# Patient Record
Sex: Female | Born: 1955
Health system: Southern US, Community
[De-identification: ages and names within clinical notes are randomized; demographics above are authoritative.]

## PROBLEM LIST (undated history)

## (undated) DIAGNOSIS — D649 Anemia, unspecified: Secondary | ICD-10-CM

## (undated) DIAGNOSIS — K8020 Calculus of gallbladder without cholecystitis without obstruction: Secondary | ICD-10-CM

## (undated) DIAGNOSIS — R011 Cardiac murmur, unspecified: Secondary | ICD-10-CM

## (undated) DIAGNOSIS — K635 Polyp of colon: Secondary | ICD-10-CM

## (undated) DIAGNOSIS — J309 Allergic rhinitis, unspecified: Secondary | ICD-10-CM

## (undated) DIAGNOSIS — E0500 Thyrotoxicosis with diffuse goiter without thyrotoxic crisis or storm: Secondary | ICD-10-CM

## (undated) DIAGNOSIS — M810 Age-related osteoporosis without current pathological fracture: Secondary | ICD-10-CM

## (undated) DIAGNOSIS — E785 Hyperlipidemia, unspecified: Secondary | ICD-10-CM

## (undated) DIAGNOSIS — F419 Anxiety disorder, unspecified: Secondary | ICD-10-CM

## (undated) DIAGNOSIS — J189 Pneumonia, unspecified organism: Secondary | ICD-10-CM

## (undated) DIAGNOSIS — F32A Depression, unspecified: Secondary | ICD-10-CM

## (undated) DIAGNOSIS — K760 Fatty (change of) liver, not elsewhere classified: Secondary | ICD-10-CM

## (undated) DIAGNOSIS — K4090 Unilateral inguinal hernia, without obstruction or gangrene, not specified as recurrent: Secondary | ICD-10-CM

## (undated) DIAGNOSIS — B370 Candidal stomatitis: Secondary | ICD-10-CM

## (undated) DIAGNOSIS — G5600 Carpal tunnel syndrome, unspecified upper limb: Secondary | ICD-10-CM

## (undated) DIAGNOSIS — F329 Major depressive disorder, single episode, unspecified: Secondary | ICD-10-CM

## (undated) DIAGNOSIS — Z79891 Long term (current) use of opiate analgesic: Secondary | ICD-10-CM

## (undated) DIAGNOSIS — F4310 Post-traumatic stress disorder, unspecified: Secondary | ICD-10-CM

## (undated) DIAGNOSIS — H269 Unspecified cataract: Secondary | ICD-10-CM

## (undated) DIAGNOSIS — M1990 Unspecified osteoarthritis, unspecified site: Secondary | ICD-10-CM

## (undated) DIAGNOSIS — T7840XA Allergy, unspecified, initial encounter: Secondary | ICD-10-CM

## (undated) DIAGNOSIS — R42 Dizziness and giddiness: Secondary | ICD-10-CM

## (undated) HISTORY — DX: Anxiety disorder, unspecified: F41.9

## (undated) HISTORY — PX: UPPER GASTROINTESTINAL ENDOSCOPY: SHX188

## (undated) HISTORY — DX: Fatty (change of) liver, not elsewhere classified: K76.0

## (undated) HISTORY — DX: Unspecified osteoarthritis, unspecified site: M19.90

## (undated) HISTORY — DX: Unilateral inguinal hernia, without obstruction or gangrene, not specified as recurrent: K40.90

## (undated) HISTORY — DX: Hyperlipidemia, unspecified: E78.5

## (undated) HISTORY — DX: Allergy, unspecified, initial encounter: T78.40XA

## (undated) HISTORY — PX: TONSILLECTOMY AND ADENOIDECTOMY: SUR1326

## (undated) HISTORY — DX: Candidal stomatitis: B37.0

## (undated) HISTORY — DX: Polyp of colon: K63.5

## (undated) HISTORY — DX: Pneumonia, unspecified organism: J18.9

## (undated) HISTORY — PX: OTHER SURGICAL HISTORY: SHX169

## (undated) HISTORY — PX: COLONOSCOPY: SHX174

## (undated) HISTORY — DX: Anemia, unspecified: D64.9

## (undated) HISTORY — PX: POLYPECTOMY: SHX149

## (undated) HISTORY — DX: Thyrotoxicosis with diffuse goiter without thyrotoxic crisis or storm: E05.00

## (undated) HISTORY — DX: Allergic rhinitis, unspecified: J30.9

## (undated) HISTORY — DX: Unspecified cataract: H26.9

## (undated) HISTORY — DX: Long term (current) use of opiate analgesic: Z79.891

## (undated) HISTORY — DX: Major depressive disorder, single episode, unspecified: F32.9

## (undated) HISTORY — DX: Post-traumatic stress disorder, unspecified: F43.10

## (undated) HISTORY — PX: CERVICAL DISC SURGERY: SHX588

## (undated) HISTORY — PX: INGUINAL HERNIA REPAIR: SUR1180

## (undated) HISTORY — DX: Dizziness and giddiness: R42

## (undated) HISTORY — DX: Carpal tunnel syndrome, unspecified upper limb: G56.00

## (undated) HISTORY — DX: Depression, unspecified: F32.A

## (undated) HISTORY — DX: Calculus of gallbladder without cholecystitis without obstruction: K80.20

## (undated) HISTORY — PX: APPENDECTOMY: SHX54

## (undated) HISTORY — DX: Cardiac murmur, unspecified: R01.1

## (undated) HISTORY — DX: Age-related osteoporosis without current pathological fracture: M81.0

## (undated) HISTORY — PX: CHOLECYSTECTOMY: SHX55

---

## 1999-01-07 ENCOUNTER — Other Ambulatory Visit: Admission: RE | Admit: 1999-01-07 | Discharge: 1999-01-07 | Payer: Self-pay | Admitting: Gynecology

## 1999-09-24 ENCOUNTER — Encounter: Payer: Self-pay | Admitting: Internal Medicine

## 1999-09-24 ENCOUNTER — Ambulatory Visit (HOSPITAL_COMMUNITY): Admission: RE | Admit: 1999-09-24 | Discharge: 1999-09-24 | Payer: Self-pay | Admitting: Internal Medicine

## 2000-04-13 ENCOUNTER — Other Ambulatory Visit: Admission: RE | Admit: 2000-04-13 | Discharge: 2000-04-13 | Payer: Self-pay | Admitting: Gynecology

## 2001-12-07 ENCOUNTER — Emergency Department (HOSPITAL_COMMUNITY): Admission: EM | Admit: 2001-12-07 | Discharge: 2001-12-07 | Payer: Self-pay | Admitting: Emergency Medicine

## 2001-12-07 ENCOUNTER — Encounter: Payer: Self-pay | Admitting: Emergency Medicine

## 2002-09-13 ENCOUNTER — Encounter: Admission: RE | Admit: 2002-09-13 | Discharge: 2002-09-13 | Payer: Self-pay | Admitting: Family Medicine

## 2002-10-31 ENCOUNTER — Encounter: Admission: RE | Admit: 2002-10-31 | Discharge: 2002-10-31 | Payer: Self-pay | Admitting: Family Medicine

## 2002-11-25 ENCOUNTER — Encounter: Admission: RE | Admit: 2002-11-25 | Discharge: 2002-11-25 | Payer: Self-pay | Admitting: Family Medicine

## 2002-12-18 ENCOUNTER — Encounter: Admission: RE | Admit: 2002-12-18 | Discharge: 2002-12-18 | Payer: Self-pay | Admitting: Family Medicine

## 2002-12-19 ENCOUNTER — Emergency Department (HOSPITAL_COMMUNITY): Admission: EM | Admit: 2002-12-19 | Discharge: 2002-12-19 | Payer: Self-pay | Admitting: Emergency Medicine

## 2003-01-07 ENCOUNTER — Encounter: Admission: RE | Admit: 2003-01-07 | Discharge: 2003-03-07 | Payer: Self-pay | Admitting: Orthopedic Surgery

## 2003-08-30 DIAGNOSIS — E0500 Thyrotoxicosis with diffuse goiter without thyrotoxic crisis or storm: Secondary | ICD-10-CM

## 2003-08-30 HISTORY — DX: Thyrotoxicosis with diffuse goiter without thyrotoxic crisis or storm: E05.00

## 2003-12-25 ENCOUNTER — Encounter: Admission: RE | Admit: 2003-12-25 | Discharge: 2004-03-24 | Payer: Self-pay | Admitting: Orthopedic Surgery

## 2004-01-04 ENCOUNTER — Encounter: Admission: RE | Admit: 2004-01-04 | Discharge: 2004-01-04 | Payer: Self-pay | Admitting: Orthopedic Surgery

## 2005-05-27 ENCOUNTER — Encounter: Admission: RE | Admit: 2005-05-27 | Discharge: 2005-05-27 | Payer: Self-pay | Admitting: Internal Medicine

## 2005-07-29 ENCOUNTER — Encounter: Admission: RE | Admit: 2005-07-29 | Discharge: 2005-07-29 | Payer: Self-pay | Admitting: Neurology

## 2006-10-26 DIAGNOSIS — H60399 Other infective otitis externa, unspecified ear: Secondary | ICD-10-CM | POA: Insufficient documentation

## 2006-10-26 DIAGNOSIS — F339 Major depressive disorder, recurrent, unspecified: Secondary | ICD-10-CM

## 2010-01-21 ENCOUNTER — Encounter: Admission: RE | Admit: 2010-01-21 | Discharge: 2010-01-21 | Payer: Self-pay | Admitting: Orthopedic Surgery

## 2010-02-23 ENCOUNTER — Encounter: Payer: Self-pay | Admitting: *Deleted

## 2010-08-26 ENCOUNTER — Encounter
Admission: RE | Admit: 2010-08-26 | Discharge: 2010-08-26 | Payer: Self-pay | Source: Home / Self Care | Attending: Internal Medicine | Admitting: Internal Medicine

## 2010-09-28 NOTE — Miscellaneous (Signed)
Summary: Dr. Zenda Alpers office  Clinical Lists Changes rec'd mssage from [place] at this md office. pt needs hand surgery & they want our NPI. told her no. has not been here in 4 yrs. we are on her card & she has been being seen at another ortho md. had medicare with medicaid secondary. she will call pt & tell her to find a pcp & get our name off her card. told her there are 2 new clinics on [address] that are taking new pts.Golden Circle RN  February 23, 2010 3:11 PM

## 2010-10-12 ENCOUNTER — Ambulatory Visit (HOSPITAL_COMMUNITY)
Admission: RE | Admit: 2010-10-12 | Discharge: 2010-10-12 | Disposition: A | Discharge status: Home / Self Care | Payer: Medicare Other | Source: Ambulatory Visit | Attending: Neurosurgery | Admitting: Neurosurgery

## 2010-10-12 ENCOUNTER — Other Ambulatory Visit (HOSPITAL_COMMUNITY): Payer: Self-pay | Admitting: Neurosurgery

## 2010-10-12 ENCOUNTER — Encounter (HOSPITAL_COMMUNITY)
Admission: RE | Admit: 2010-10-12 | Discharge: 2010-10-12 | Disposition: A | Discharge status: Home / Self Care | Payer: Medicare Other | Source: Ambulatory Visit | Attending: Neurosurgery | Admitting: Neurosurgery

## 2010-10-12 DIAGNOSIS — F17200 Nicotine dependence, unspecified, uncomplicated: Secondary | ICD-10-CM | POA: Insufficient documentation

## 2010-10-12 DIAGNOSIS — M47812 Spondylosis without myelopathy or radiculopathy, cervical region: Secondary | ICD-10-CM | POA: Insufficient documentation

## 2010-10-12 DIAGNOSIS — I10 Essential (primary) hypertension: Secondary | ICD-10-CM | POA: Insufficient documentation

## 2010-10-12 DIAGNOSIS — Z01818 Encounter for other preprocedural examination: Secondary | ICD-10-CM | POA: Insufficient documentation

## 2010-10-12 LAB — CBC
HCT: 39.7 % (ref 36.0–46.0)
Hemoglobin: 13.1 g/dL (ref 12.0–15.0)
MCHC: 33 g/dL (ref 30.0–36.0)
MCV: 92.5 fL (ref 78.0–100.0)
RDW: 13.4 % (ref 11.5–15.5)
WBC: 7.4 10*3/uL (ref 4.0–10.5)

## 2010-10-12 LAB — BASIC METABOLIC PANEL
BUN: 5 mg/dL — ABNORMAL LOW (ref 6–23)
CO2: 30 mEq/L (ref 19–32)
Calcium: 9.4 mg/dL (ref 8.4–10.5)
Chloride: 102 mEq/L (ref 96–112)
Creatinine, Ser: 0.88 mg/dL (ref 0.4–1.2)
GFR calc non Af Amer: >60 mL/min (ref >60)

## 2010-10-12 LAB — SURGICAL PCR SCREEN: Staphylococcus aureus: NEGATIVE

## 2010-10-19 ENCOUNTER — Inpatient Hospital Stay (HOSPITAL_COMMUNITY): Payer: Medicare Other

## 2010-10-19 ENCOUNTER — Other Ambulatory Visit (HOSPITAL_COMMUNITY): Payer: Self-pay | Admitting: Neurosurgery

## 2010-10-19 ENCOUNTER — Observation Stay (HOSPITAL_COMMUNITY)
Admission: RE | Admit: 2010-10-19 | Discharge: 2010-10-20 | Disposition: A | Discharge status: Home / Self Care | Payer: Medicare Other | Source: Ambulatory Visit | Attending: Neurosurgery | Admitting: Neurosurgery

## 2010-10-19 DIAGNOSIS — M47812 Spondylosis without myelopathy or radiculopathy, cervical region: Secondary | ICD-10-CM | POA: Insufficient documentation

## 2010-10-19 DIAGNOSIS — J449 Chronic obstructive pulmonary disease, unspecified: Secondary | ICD-10-CM | POA: Insufficient documentation

## 2010-10-19 DIAGNOSIS — M5030 Other cervical disc degeneration, unspecified cervical region: Secondary | ICD-10-CM | POA: Insufficient documentation

## 2010-10-19 DIAGNOSIS — M5020 Other cervical disc displacement, unspecified cervical region: Principal | ICD-10-CM | POA: Insufficient documentation

## 2010-10-19 DIAGNOSIS — J4489 Other specified chronic obstructive pulmonary disease: Secondary | ICD-10-CM | POA: Insufficient documentation

## 2010-10-19 DIAGNOSIS — G8929 Other chronic pain: Secondary | ICD-10-CM | POA: Insufficient documentation

## 2010-10-21 NOTE — Op Note (Signed)
[name], [name]            ACCOUNT NO.:  [id number]  MEDICAL RECORD NO.:  [id number]           PATIENT TYPE:  I  LOCATION:  3535                         FACILITY:  [facility]  PHYSICIAN:  Danae Orleans. Venetia Maxon, M.D.  DATE OF BIRTH:  04-10-1956  DATE OF PROCEDURE:  10/19/2010 DATE OF DISCHARGE:                              OPERATIVE REPORT   PREOPERATIVE DIAGNOSIS:  Herniated cervical disk with spondylosis, degenerative disk disease, and radiculopathy C5-C6 and C6-C7 levels.  POSTOPERATIVE DIAGNOSIS:  Herniated cervical disk with spondylosis, degenerative disk disease, and radiculopathy C5-C6 and C6-C7 levels.  PROCEDURE:  Anterior cervical decompression and fusion C5-C6 and C6-C7 levels, autograft, allograft, PureGen, and anterior cervical plate.  SURGEON:  Danae Orleans. Venetia Maxon, MD  ASSISTANTS: 1. Georgiann Cocker, RN 2. Coletta Memos, MD  ANESTHESIA:  General endotracheal anesthesia.  ESTIMATED BLOOD LOSS:  Minimal.  COMPLICATIONS:  None.  DISPOSITION:  Recovery.  INDICATIONS:  [name] is a 55 year old woman with significant cervical spondylosis C5-C6 and C6-C7 levels on the left with left C6 and C7 radiculopathy.  It was elected to take her to Surgery for anterior decompression and fusion at these affected levels.  DESCRIPTION OF PROCEDURE:  [name] was brought to the operating room.  Following satisfactory and uncomplicated induction of general endotracheal anesthesia plus intravenous lines, the patient was placed in the supine position on the operating table.  Her neck was placed in slight extension.  She was placed in 5-pound halter traction.  Her anterior neck was then prepped and draped in usual sterile fashion. Area of planned incision was infiltrated with local lidocaine.  Incision was made from the midline to the anterior border of the sternocleidomastoid muscle, carried sharply through the platysma layer. Subplatysmal dissection was performed exposing the  anterior border of sternocleidomastoid muscle using blunt dissection and the carotid sheath was kept lateral, trachea and esophagus kept medial, exposing the anterior cervical spine.  A bent spinal needle was placed at what was felt to be C4-C5 and C5-C6 levels and this was confirmed on intraoperative x-ray.  Subsequently, the longus colli muscles were taken down from C5 through C7 using electrocautery and Key elevator and self- retaining and shadow line retractors were placed to facilitate exposure. The ventral osteophytes were removed at both the C5-C6 and C6-C7 levels. Thorough diskectomies were performed at each level.  Endplates were eburnated with high-speed drill and uncinate spurs were drilled down. The spinal cord dura was decompressed as were both C6 nerve roots and subsequently C5-C6 and C7 nerve roots at C6-C7.  After trial sizing, 5- mm PEEK interbody cages were packed with PureGen-soaked bone sponge and also autograft and a similarly-sized implant was placed at the C5-C6 level.  A 30-mm Trestle anterior cervical plate was then affixed to anterior cervical spine using variable-angled 12-mm screws, 2 at C5, 2 at C6, 2 at C7; all screws had excellent purchases.  Locking mechanisms were engaged.  Final x-ray demonstrated well-positioned upper aspect of the construct.  The wound was irrigated.  Soft tissues were inspected and found to be in good repair.  Hemostasis was assured.  The platysma layer was closed with 3-0  Vicryl sutures.  Skin edges were approximated with 3-0 Vicryl subcuticular stitch.  The wound was dressed with Dermabond.  The patient was extubated in the operating room and taken to the recovery in stable and satisfactory condition having tolerated the operation well.  Counts were correct at the end of the case.     Danae Orleans. Venetia Maxon, M.D.     JDS/MEDQ  D:  10/19/2010  T:  10/20/2010  Job:  [id number]  Electronically Signed by Maeola Harman M.D. on 10/21/2010  07:37:27 AM

## 2010-12-22 LAB — TSH: TSH: 0.78 u[IU]/mL (ref <=5.90)

## 2011-04-15 LAB — BASIC METABOLIC PANEL: Glucose: 103 mg/dL

## 2011-04-15 LAB — LIPID PANEL
LDL Cholesterol: 227 mg/dL
Triglycerides: 227 mg/dL — AB (ref 40–160)

## 2011-06-29 LAB — LIPID PANEL
LDL Cholesterol: 112 mg/dL
Triglycerides: 167 mg/dL — AB (ref 40–160)

## 2011-08-22 ENCOUNTER — Ambulatory Visit (INDEPENDENT_AMBULATORY_CARE_PROVIDER_SITE_OTHER): Payer: Medicare Other

## 2011-08-22 DIAGNOSIS — M79609 Pain in unspecified limb: Secondary | ICD-10-CM

## 2011-08-22 DIAGNOSIS — M19049 Primary osteoarthritis, unspecified hand: Secondary | ICD-10-CM

## 2011-10-13 DIAGNOSIS — E0500 Thyrotoxicosis with diffuse goiter without thyrotoxic crisis or storm: Secondary | ICD-10-CM | POA: Diagnosis not present

## 2011-10-18 ENCOUNTER — Telehealth: Payer: Self-pay

## 2011-10-18 DIAGNOSIS — G8929 Other chronic pain: Secondary | ICD-10-CM

## 2011-10-18 NOTE — Telephone Encounter (Signed)
.  [facility]    PT REQUESTING DEMEROL REFILL   BEST PHONE [phone]

## 2011-10-19 MED ORDER — MEPERIDINE HCL 100 MG PO TABS: 100.0000 mg | ORAL_TABLET | Freq: Four times a day (QID) | ORAL | Status: DC | PRN

## 2011-10-19 NOTE — Telephone Encounter (Signed)
Refilled 100mg  #120 leftat102 Notify can pick up

## 2011-10-19 NOTE — Telephone Encounter (Signed)
Pt notified that rx is up front for p/u. Pt already has appt 10-26-11

## 2011-10-19 NOTE — Telephone Encounter (Signed)
I think this is Dr. Netta Corrigan patient.  Please pull the chart for him and re-route the message to his In Basket.  Thanks. csj

## 2011-10-19 NOTE — Telephone Encounter (Signed)
Chart pulled and in your box.

## 2011-10-25 ENCOUNTER — Encounter: Payer: Self-pay | Admitting: Family Medicine

## 2011-10-25 DIAGNOSIS — M19049 Primary osteoarthritis, unspecified hand: Secondary | ICD-10-CM | POA: Diagnosis not present

## 2011-10-25 DIAGNOSIS — G5600 Carpal tunnel syndrome, unspecified upper limb: Secondary | ICD-10-CM | POA: Diagnosis not present

## 2011-10-26 ENCOUNTER — Ambulatory Visit (INDEPENDENT_AMBULATORY_CARE_PROVIDER_SITE_OTHER): Payer: Medicare Other | Admitting: Internal Medicine

## 2011-10-26 ENCOUNTER — Encounter: Payer: Self-pay | Admitting: Internal Medicine

## 2011-10-26 VITALS — BP 123/76 | HR 91 | Temp 97.9°F | Resp 16 | Ht 68.0 in | Wt 170.0 lb

## 2011-10-26 DIAGNOSIS — M542 Cervicalgia: Secondary | ICD-10-CM

## 2011-10-26 DIAGNOSIS — G8929 Other chronic pain: Secondary | ICD-10-CM | POA: Insufficient documentation

## 2011-10-26 DIAGNOSIS — E785 Hyperlipidemia, unspecified: Secondary | ICD-10-CM | POA: Diagnosis not present

## 2011-10-26 DIAGNOSIS — M19049 Primary osteoarthritis, unspecified hand: Secondary | ICD-10-CM | POA: Insufficient documentation

## 2011-10-26 DIAGNOSIS — G5600 Carpal tunnel syndrome, unspecified upper limb: Secondary | ICD-10-CM | POA: Insufficient documentation

## 2011-10-26 DIAGNOSIS — E039 Hypothyroidism, unspecified: Secondary | ICD-10-CM | POA: Diagnosis not present

## 2011-10-26 DIAGNOSIS — F4310 Post-traumatic stress disorder, unspecified: Secondary | ICD-10-CM | POA: Insufficient documentation

## 2011-10-26 DIAGNOSIS — J301 Allergic rhinitis due to pollen: Secondary | ICD-10-CM | POA: Insufficient documentation

## 2011-10-26 DIAGNOSIS — H9191 Unspecified hearing loss, right ear: Secondary | ICD-10-CM | POA: Insufficient documentation

## 2011-10-26 MED ORDER — DIAZEPAM 5 MG PO TABS: 5.0000 mg | ORAL_TABLET | Freq: Four times a day (QID) | ORAL | Status: AC | PRN

## 2011-10-26 MED ORDER — MEPERIDINE HCL 100 MG PO TABS: 100.0000 mg | ORAL_TABLET | Freq: Four times a day (QID) | ORAL | Status: DC | PRN

## 2011-10-27 ENCOUNTER — Telehealth: Payer: Self-pay

## 2011-10-27 LAB — LIPID PANEL
Cholesterol: 185 mg/dL (ref 0–200)
Total CHOL/HDL Ratio: 4.5 Ratio

## 2011-10-27 NOTE — Progress Notes (Signed)
  Subjective:    Patient ID: [name], female    DOB: February 26, 1956, 56 y.o.   MRN: [id number]  HPI Followup for hypothyroidism, Hyperlipidemia, allergic rhinitis , chronic neck pain, carpal tunnel syndrome, PTSD, and major depression. Has a new diagnosis of degenerative arthritis of the first [facility] joints bilaterally by Dr. Ranell Patrick  Chronic pain meds are working/notes which to 50 mcg of Synthroid after last labs in November/depression is stable though mood could be elevated.  Has no side effects from Lipitor/Nasonex as controlling allergies/ Depression and PTSD are stable off medication except for occasional Valium for anxiety    Review of SystemsHas no new complaints at this point     Objective:   Physical Exam Vital signs stable HEENT is clear Neck range of motion is limited as usual There is no thyromegaly There is pain and limited range of motion about the first [facility] joint bilaterally       Assessment & Plan:  Problem #1 chronic neck pain-2 continue Demerol 100 mg 4 times a day for the next 3 months with gentle range of motion/Flexeril as needed/may call for refills  Problem #2 hyperlipidemia labs will be checked at 40 mg of Lipitor  Problem #3 hypothyroidism-labs will be checked at 50 mcg of Synthroid  Problem #4 depression and PTSD-thallium will be refilled and she will start to do home therapy with the Book depression for dummies  Problem #5 first [facility] arthritis-she needs a thumb spica splint for sleeping for the right hand she has one for the left-hand/we will refer her to hand in rehabilitation for the application of neoprene sleeves to control joint motion during the daytime//  Problem #6 allergic rhinitis-no change

## 2011-10-27 NOTE — Telephone Encounter (Signed)
.  [facility] PT STATES WE WERE SUPPOSE TO MAKE AN URGENT REFERRAL FOR PT TO A HAND AND REHAB PLACE HASN'T HEARD FROM ANYONE AND NO ONE IN REFERRALS KNOW ANYTHING ABOUT IT. PLEASE CALL [phone] OR [phone]

## 2011-10-28 ENCOUNTER — Telehealth: Payer: Self-pay

## 2011-10-28 ENCOUNTER — Encounter: Payer: Self-pay | Admitting: Internal Medicine

## 2011-10-28 MED ORDER — LEVOTHYROXINE SODIUM 50 MCG PO TABS: 50.0000 ug | ORAL_TABLET | Freq: Every day | ORAL | Status: DC

## 2011-10-28 NOTE — Telephone Encounter (Signed)
Pt uses [facility] on [address] they close @ 530 she thinks she states she hasnt had her medication in three days

## 2011-10-28 NOTE — Progress Notes (Signed)
Addended by: Tonye Pearson on: 10/28/2011 03:45 PM   Modules accepted: Orders

## 2011-10-28 NOTE — Telephone Encounter (Signed)
See previous message...  Spoke with Marcelino Duster at 104, she states note is there stating that Burna Mortimer told pt on 10/26/11 that Dr. Merla Riches would refer her to Hand and Rehab.  Order was not put in system, so MD did this today.  LMOM advising patient that we would call her next week re: appt.  Also, TL has rx for patients Synthroid and needs to know pharmacy to send it to.

## 2011-10-28 NOTE — Telephone Encounter (Signed)
Pt is returning phone call she didn't hear her phone ringing and doesn't know why someone from here is calling her.Marland Kitchen

## 2011-10-28 NOTE — Telephone Encounter (Signed)
See next tele. msg  

## 2011-10-28 NOTE — Telephone Encounter (Signed)
.  [facility] PT STATES WE WERE SUPPOSE TO REFER HER TO HAND AND REHAB, THIS IS HER 3RD CALL REGARDING THIS. SHE THOUGHT IT WAS SUPPOSE TO BE AN URGENT REFERRAL PLEASE CALL [phone]

## 2011-10-28 NOTE — Telephone Encounter (Signed)
Rx called in and patient notified.  

## 2011-11-16 DIAGNOSIS — M19049 Primary osteoarthritis, unspecified hand: Secondary | ICD-10-CM | POA: Diagnosis not present

## 2011-12-07 ENCOUNTER — Telehealth: Payer: Self-pay

## 2011-12-07 NOTE — Telephone Encounter (Signed)
PT STATES DR DOOLITTLE HAD GIVEN HER A HAND WRITTEN PRESCRIPTIONS FOR HER MEDS, BUT HE HAD IT DATED FOR 1 WEEK AFTER HER MEDICINES RUN OUT PLEASE CALL [phone]

## 2011-12-10 NOTE — Telephone Encounter (Signed)
Not sure why this has shown up in my box today when it was on 4/10 that she called in/aand it was not in my box as I ended the day yesterday/hope she has not run out of medicine She had 3 prescriptions on February 27 for 120 pills so they should be due 2/27, 3/ 27 and 4/27-I'm glad to correct anything if a mistake was made/ and she has a great track record of always being in compliance with no problems

## 2011-12-10 NOTE — Telephone Encounter (Signed)
Advised pt that she should have enough meds to last her til 12/23/11

## 2012-01-31 ENCOUNTER — Other Ambulatory Visit: Payer: Self-pay | Admitting: Internal Medicine

## 2012-01-31 ENCOUNTER — Telehealth: Payer: Self-pay

## 2012-01-31 NOTE — Telephone Encounter (Signed)
cyclobenzaprine (FLEXERIL) 10 MG tablet  Quantity 90 1 tab 3 x daily  [facility] pharmacy filled this on 4/15 Dr. Merla Riches  [phone]

## 2012-02-01 MED ORDER — CYCLOBENZAPRINE HCL 10 MG PO TABS: 10.0000 mg | ORAL_TABLET | Freq: Three times a day (TID) | ORAL | Status: DC | PRN

## 2012-02-01 NOTE — Telephone Encounter (Signed)
Called pharm to verify what they needed. Sending over RFs of pt's Flexeril per Dr Netta Corrigan note from Feb OV

## 2012-02-07 ENCOUNTER — Telehealth: Payer: Self-pay

## 2012-02-07 MED ORDER — MOMETASONE FUROATE 50 MCG/ACT NA SUSP: 2.0000 | Freq: Every day | NASAL | Status: DC

## 2012-02-07 NOTE — Telephone Encounter (Signed)
Rx done and sent to pharmacy 

## 2012-02-07 NOTE — Telephone Encounter (Signed)
Pt.notified

## 2012-02-07 NOTE — Telephone Encounter (Signed)
PATIENT SWITCHED HER PHARMACY TO [facility] ON [address]  SAID SHE WAS ABLE TO GET ALL HER MEDICINE, BUT THE NASONEX.  NEEDS A REFILL ON THIS

## 2012-02-14 ENCOUNTER — Telehealth: Payer: Self-pay

## 2012-02-14 DIAGNOSIS — G8929 Other chronic pain: Secondary | ICD-10-CM

## 2012-02-14 NOTE — Telephone Encounter (Signed)
PT STATES SHE IS IN NEED OF HER PAIN MEDICINE. HER MOM IS IN THE HOSPITAL AND WILL BE THERE FOR ANOTHER WEEK, SO PLEASE CALL [phone]

## 2012-02-15 MED ORDER — MEPERIDINE HCL 100 MG PO TABS: 100.0000 mg | ORAL_TABLET | Freq: Four times a day (QID) | ORAL | Status: DC | PRN

## 2012-02-15 NOTE — Telephone Encounter (Signed)
Enough to last until her followup in August

## 2012-02-15 NOTE — Telephone Encounter (Signed)
Dr. Merla Riches- Please respond to this request for Demerol. Thanks.

## 2012-02-29 ENCOUNTER — Encounter: Payer: Self-pay | Admitting: Internal Medicine

## 2012-02-29 ENCOUNTER — Ambulatory Visit (INDEPENDENT_AMBULATORY_CARE_PROVIDER_SITE_OTHER): Payer: Medicare Other | Admitting: Internal Medicine

## 2012-02-29 VITALS — BP 118/74 | HR 81 | Temp 97.0°F | Resp 16 | Ht 69.0 in | Wt 173.8 lb

## 2012-02-29 DIAGNOSIS — E039 Hypothyroidism, unspecified: Secondary | ICD-10-CM | POA: Diagnosis not present

## 2012-02-29 DIAGNOSIS — F419 Anxiety disorder, unspecified: Secondary | ICD-10-CM

## 2012-02-29 DIAGNOSIS — F411 Generalized anxiety disorder: Secondary | ICD-10-CM

## 2012-02-29 DIAGNOSIS — G8929 Other chronic pain: Secondary | ICD-10-CM | POA: Diagnosis not present

## 2012-02-29 DIAGNOSIS — E785 Hyperlipidemia, unspecified: Secondary | ICD-10-CM

## 2012-02-29 LAB — LIPID PANEL
Cholesterol: 215 mg/dL — ABNORMAL HIGH (ref 0–200)
LDL Cholesterol: 120 mg/dL — ABNORMAL HIGH (ref 0–99)
Total CHOL/HDL Ratio: 5.4 Ratio
VLDL: 55 mg/dL — ABNORMAL HIGH (ref 0–40)

## 2012-02-29 LAB — CBC WITH DIFFERENTIAL/PLATELET
Basophils Relative: 1 % (ref 0–1)
HCT: 40.9 % (ref 36.0–46.0)
Hemoglobin: 13.5 g/dL (ref 12.0–15.0)
MCH: 29.9 pg (ref 26.0–34.0)
MCHC: 33 g/dL (ref 30.0–36.0)
MCV: 90.5 fL (ref 78.0–100.0)
Monocytes Absolute: 0.4 10*3/uL (ref 0.1–1.0)
Monocytes Relative: 7 % (ref 3–12)
Neutro Abs: 2.9 10*3/uL (ref 1.7–7.7)

## 2012-02-29 LAB — COMPREHENSIVE METABOLIC PANEL
Albumin: 4.5 g/dL (ref 3.5–5.2)
Alkaline Phosphatase: 92 U/L (ref 39–117)
BUN: 6 mg/dL (ref 6–23)
Calcium: 9.3 mg/dL (ref 8.4–10.5)
Glucose, Bld: 91 mg/dL (ref 70–99)
Potassium: 4.1 mEq/L (ref 3.5–5.3)

## 2012-02-29 LAB — TSH: TSH: 3.37 u[IU]/mL (ref 0.350–4.500)

## 2012-02-29 MED ORDER — ATORVASTATIN CALCIUM 40 MG PO TABS: 40.0000 mg | ORAL_TABLET | Freq: Every day | ORAL | Status: DC

## 2012-02-29 MED ORDER — MEPERIDINE HCL 100 MG PO TABS: 100.0000 mg | ORAL_TABLET | Freq: Four times a day (QID) | ORAL | Status: DC | PRN

## 2012-02-29 MED ORDER — DIAZEPAM 5 MG PO TABS: 5.0000 mg | ORAL_TABLET | Freq: Four times a day (QID) | ORAL | Status: DC | PRN

## 2012-02-29 NOTE — Progress Notes (Signed)
Subjective:    Patient ID: [name], female    DOB: 18-Sep-1955, 56 y.o.   MRN: [id number]  HPI Patient Active Problem List  Diagnosis  . GRAVES DISEASE  . DEPRESSION, MAJOR, RECURRENT  . OTITIS EXTERNA, UNSPECIFIED  . Allergic rhinitis due to pollen  . Chronic neck pain  . CMC arthritis  . CTS (carpal tunnel syndrome)  . PTSD (post-traumatic stress disorder)  . Hyperlipemia  . Hearing loss in right ear   Time to recheck labs Lots of stress recently/28 year old mother had to have surgery for prolapsed rectum/aspiration pneumonia in the aftermath/now in the ICU and in the hospital for 3 weeks 3 cars in her driveway that don't work One child moved back home living in the driveway in a [place] Chronic pain only partially was relieved by her current regimen/she's not ready yet to consider chronic pain management clinic   Review of Systems  Constitutional: Negative for fever, activity change, appetite change, fatigue and unexpected weight change.  HENT: Positive for congestion, rhinorrhea, sneezing and postnasal drip. Negative for hearing loss.        Nasonex helping/was a bad allergy season  Eyes: Negative for visual disturbance.  Respiratory: Negative for chest tightness, shortness of breath and wheezing.   Cardiovascular: Negative for chest pain, palpitations and leg swelling.  Gastrointestinal: Negative.   Genitourinary: Negative.   Neurological: Negative for weakness and numbness.  Hematological: Does not bruise/bleed easily.  Psychiatric/Behavioral: Negative for suicidal ideas, hallucinations and self-injury.       No depression or anxiety interfering with her current life despite so many stressors       Objective:   Physical Exam  Constitutional: She is oriented to person, place, and time. She appears well-developed and well-nourished.  HENT:  Nose: Nose normal.  Mouth/Throat: Oropharynx is clear and moist.  Eyes: Conjunctivae and EOM are normal. Pupils are  equal, round, and reactive to light.  Neck: No thyromegaly present.  Cardiovascular: Normal rate and regular rhythm.   Lymphadenopathy:    She has no cervical adenopathy.  Neurological: She is alert and oriented to person, place, and time. No cranial nerve deficit.  Psychiatric: She has a normal mood and affect. Her behavior is normal. Judgment and thought content normal.       Assessment & Plan:   1. Other and unspecified hyperlipidemia  Lipid panel, atorvastatin (LIPITOR) 40 MG tablet, CBC with Differential, Comprehensive metabolic panel  2. Hypothyroid  T4, Free, TSH, CBC with Differential, Comprehensive metabolic panel  3. Chronic pain  meperidine (DEMEROL) 100 MG tablet, meperidine (DEMEROL) 100 MG tablet, meperidine (DEMEROL) 100 MG tablet, Comprehensive metabolic panel  4. Anxiety  diazepam (VALIUM) 5 MG tablet   Meds ordered this encounter  Medications  . atorvastatin (LIPITOR) 40 MG tablet    Sig: Take 1 tablet (40 mg total) by mouth daily.    Dispense:  90 tablet    Refill:  1  . diazepam (VALIUM) 5 MG tablet    Sig: Take 1 tablet (5 mg total) by mouth every 6 (six) hours as needed.    Dispense:  120 tablet    Refill:  5  . meperidine (DEMEROL) 100 MG tablet    Sig: Take 1 tablet (100 mg total) by mouth every 6 (six) hours as needed for pain.    Dispense:  120 tablet    Refill:  0  . meperidine (DEMEROL) 100 MG tablet    Sig: Take 1 tablet (100 mg total) by mouth every  6 (six) hours as needed for pain.    Dispense:  120 tablet    Refill:  0  . meperidine (DEMEROL) 100 MG tablet    Sig: Take 1 tablet (100 mg total) by mouth every 6 (six) hours as needed for pain.    Dispense:  120 tablet    Refill:  0   Followup 3 months or call for refills Routine labs by mail

## 2012-03-02 ENCOUNTER — Encounter: Payer: Self-pay | Admitting: Internal Medicine

## 2012-06-16 ENCOUNTER — Other Ambulatory Visit: Payer: Self-pay | Admitting: Internal Medicine

## 2012-07-04 ENCOUNTER — Encounter: Payer: Self-pay | Admitting: Internal Medicine

## 2012-07-04 ENCOUNTER — Ambulatory Visit (INDEPENDENT_AMBULATORY_CARE_PROVIDER_SITE_OTHER): Payer: Medicare Other | Admitting: Internal Medicine

## 2012-07-04 VITALS — BP 124/88 | HR 82 | Temp 98.5°F | Resp 16 | Ht 68.5 in | Wt 174.0 lb

## 2012-07-04 DIAGNOSIS — G8929 Other chronic pain: Secondary | ICD-10-CM | POA: Diagnosis not present

## 2012-07-04 DIAGNOSIS — F4310 Post-traumatic stress disorder, unspecified: Secondary | ICD-10-CM | POA: Diagnosis not present

## 2012-07-04 DIAGNOSIS — F339 Major depressive disorder, recurrent, unspecified: Secondary | ICD-10-CM | POA: Diagnosis not present

## 2012-07-04 DIAGNOSIS — M542 Cervicalgia: Secondary | ICD-10-CM | POA: Diagnosis not present

## 2012-07-04 DIAGNOSIS — E0500 Thyrotoxicosis with diffuse goiter without thyrotoxic crisis or storm: Secondary | ICD-10-CM

## 2012-07-04 MED ORDER — MEPERIDINE HCL 100 MG PO TABS: 100.0000 mg | ORAL_TABLET | Freq: Four times a day (QID) | ORAL | Status: DC | PRN

## 2012-07-04 MED ORDER — CYCLOBENZAPRINE HCL 10 MG PO TABS: 10.0000 mg | ORAL_TABLET | Freq: Three times a day (TID) | ORAL | Status: DC | PRN

## 2012-07-04 MED ORDER — AMITRIPTYLINE HCL 25 MG PO TABS: ORAL_TABLET | ORAL | Status: DC

## 2012-07-04 NOTE — Progress Notes (Signed)
Subjective:    Patient ID: [name], female    DOB: 1956/07/19, 56 y.o.   MRN: [id number]  CC: 56 yo Pt w/chronic neck pain c/o worsening pain and depression.  HPI Pt says her medication is not has effective for managing her pain as it has been in the past.  We discussed seeing a pain management clinic and using long acting pain meds.  She says that medications such as hydrocodone and oxycodone make her "sick."  She says her sx has not helped her in the long run and is also not interested in having neck injections.  She had a not helpful experience with steroid inj to her back in the past.  She also tried Neurontin and lidocaine patches w/o success.  She tried taking 1  bid about 1 year ago and was not able to continue this regimen because of nausea and loss of appetite.  But, she is willing to try Oxymorphone again w/ an antinausea med on board.    She says she knows she is depressed, but is trying to stay away from antidepressants.  In the past she has tried at least Prozac and Wellbutrin w/o success.  Amitriptyline was discussed because of its antidepressant and analgesic properties.    Her mom is in a rehab home, and her trach came out last Friday.  As soon as her NG tube is removed she can come home.  She is the primary caregiver for her mom who lives on her property.  Pt declines colonoscopy.  She usually sees Dr. Laural Roes? for her pap smears and plans to follow up w/him.  He also does fecal occult blood testing  She has scar tissue developed in her liver from her gallbladder sx? It hurts her and she is interested in a follow up ultrasound.  She is going to follow up on this with Dr. Laural Roes?Marland Kitchen   PMHx: Patient Active Problem List  Diagnosis  . GRAVES DISEASE  . DEPRESSION, MAJOR, RECURRENT  . OTITIS EXTERNA, UNSPECIFIED  . Allergic rhinitis due to pollen  . Chronic neck pain  . CMC arthritis  . CTS (carpal tunnel syndrome)  . PTSD (post-traumatic stress disorder)  . Hyperlipemia    . Hearing loss in right ear     Review of Systems Noncontributory     Objective:   Physical Exam General: 56 yo F Vitals:  Filed Vitals:   07/04/12 1113  BP: 124/88  Pulse: 82  Temp: 98.5 F (36.9 C)  Resp: 16  HEENT: Nontraumatic, EOMIT, Wearing glasses, Normal to ext exam, Trachea midline Heart: Regular rate Lung: NAD MSK: Normal bulk and tone Neuro: Alert, oriented, CN II - XII grossly IT      Assessment & Plan:   1. Chronic pain  amitriptyline (ELAVIL) 25 MG tablet, cyclobenzaprine (FLEXERIL) 10 MG tablet, meperidine (DEMEROL) 100 MG tablet, meperidine (DEMEROL) 100 MG tablet, meperidine (DEMEROL) 100 MG tablet  2. Chronic neck pain    3. DEPRESSION, MAJOR, RECURRENT    4. GRAVES DISEASE    5. PTSD (post-traumatic stress disorder)     Okay to call for refills of her medications for her allergy, hyperlipidemia, nausea etc. Before her followup in 3 months Meds ordered this encounter  Medications  . aspirin 81 MG tablet    Sig: Take 81 mg by mouth daily.  Marland Kitchen amitriptyline (ELAVIL) 25 MG tablet    Sig: 1 tab hs advancing to 2 tabs if needed    Dispense:  60 tablet  Refill:  2  . cyclobenzaprine (FLEXERIL) 10 MG tablet    Sig: Take 1 tablet (10 mg total) by mouth 3 (three) times daily as needed for muscle spasms.    Dispense:  90 tablet    Refill:  2  . meperidine (DEMEROL) 100 MG tablet    Sig: Take 1 tablet (100 mg total) by mouth every 6 (six) hours as needed for pain. For after 08/02/12    Dispense:  120 tablet    Refill:  0  . meperidine (DEMEROL) 100 MG tablet    Sig: Take 1 tablet (100 mg total) by mouth every 6 (six) hours as needed for pain.    Dispense:  120 tablet    Refill:  0  . meperidine (DEMEROL) 100 MG tablet    Sig: Take 1 tablet (100 mg total) by mouth every 6 (six) hours as needed for pain. For after 09/01/12    Dispense:  120 tablet    Refill:  0   We will consider  Transition to another pain medicine/oxymorphone 12 hour would be a  consideration though she has had some nausea with this when we last tried it She is not a candidate for chronic pain clinic because she uses tetrahydrocannabinol as her regular pain reducing medication/this also helps her anxiety

## 2012-08-02 ENCOUNTER — Telehealth: Payer: Self-pay

## 2012-08-02 NOTE — Telephone Encounter (Signed)
Pt states she was prescribed amitriptyline 2 per day.  She says 2 are not working well, and she wants to add 1 to make 3 per day.  Using [employer] .  Best number [phone]

## 2012-08-04 MED ORDER — AMITRIPTYLINE HCL 100 MG PO TABS: 100.0000 mg | ORAL_TABLET | Freq: Every day | ORAL | Status: DC

## 2012-08-04 NOTE — Telephone Encounter (Signed)
Recommend advancing it to 4 tablets at bedtime and using a single 100 mg tablet  Pended awaiting your call

## 2012-08-04 NOTE — Telephone Encounter (Signed)
Spoke with pt and she said that taking 4 is fine. Please send in RX

## 2012-08-04 NOTE — Telephone Encounter (Signed)
Increase amitriptyline 200 mg at bedtime Meds ordered this encounter  Medications  . amitriptyline (ELAVIL) 100 MG tablet    Sig: Take 1 tablet (100 mg total) by mouth at bedtime.    Dispense:  30 tablet    Refill:  5

## 2012-09-04 ENCOUNTER — Telehealth: Payer: Self-pay | Admitting: *Deleted

## 2012-09-04 DIAGNOSIS — F419 Anxiety disorder, unspecified: Secondary | ICD-10-CM

## 2012-09-04 MED ORDER — DIAZEPAM 5 MG PO TABS: 5.0000 mg | ORAL_TABLET | Freq: Four times a day (QID) | ORAL | Status: DC | PRN

## 2012-09-04 NOTE — Telephone Encounter (Signed)
Pharmacy requesting refill on diazepam 5mg . Last refill on 08/01/12.  Pt has also called asking about refill just FYI.

## 2012-09-04 NOTE — Telephone Encounter (Signed)
Ok Meds ordered this encounter  Medications  . diazepam (VALIUM) 5 MG tablet    Sig: Take 1 tablet (5 mg total) by mouth every 6 (six) hours as needed.    Dispense:  120 tablet    Refill:  5

## 2012-09-05 NOTE — Telephone Encounter (Signed)
Called in for her

## 2012-10-15 ENCOUNTER — Telehealth: Payer: Self-pay

## 2012-10-15 ENCOUNTER — Other Ambulatory Visit: Payer: Self-pay | Admitting: Physician Assistant

## 2012-10-15 ENCOUNTER — Other Ambulatory Visit: Payer: Self-pay | Admitting: Internal Medicine

## 2012-10-15 DIAGNOSIS — G8929 Other chronic pain: Secondary | ICD-10-CM

## 2012-10-15 MED ORDER — MEPERIDINE HCL 100 MG PO TABS: 100.0000 mg | ORAL_TABLET | Freq: Four times a day (QID) | ORAL | Status: DC | PRN

## 2012-10-15 NOTE — Telephone Encounter (Signed)
PT STATES SHE IS IN NEED OF HER PAIN MEDICINE THAT SHE HAVE TO PICK UP AND HAVE AN APPT COMING UP SOON PLEASE CALL [phone] PT HAD CALLED LAST WEEK, BUT I FORGOT TO PUT IN THE PHONE MESSAGE. SORRY

## 2012-10-15 NOTE — Telephone Encounter (Signed)
Patient advised this is ready for pick up.  

## 2012-10-15 NOTE — Telephone Encounter (Signed)
Meds ordered this encounter  Medications  . meperidine (DEMEROL) 100 MG tablet    Sig: Take 1 tablet (100 mg total) by mouth every 6 (six) hours as needed for pain. For after 09/01/12    Dispense:  120 tablet    Refill:  0

## 2012-10-15 NOTE — Telephone Encounter (Signed)
Called patient, she is requesting Demerol please advise.

## 2012-10-18 ENCOUNTER — Telehealth: Payer: Self-pay

## 2012-10-18 NOTE — Telephone Encounter (Signed)
Received prior auth request for pt's Demerol 100 mg. I called [employer] to have them fax a form w/preferred alternatives listed. I received the form and the alternatives for severe pain, such as pt's, are Oxycodone, Oxycodone/APAP, Hydromorphone and Morphine.  Dr. Merla Riches, it looks like Aug/2012 you tried to Rx Oxycodone for pt and we could not get IT covered, so I'm not sure if pt has ever tried this (now it seems to be on their preferred list). We then did a PA for the Demerol and it was approved. OV notes/records show that Dilaudid was ineffective for pt and Morphine caused N/V. Do you want to try Rxing the Oxycodone for pt, or would you like me to proceed w/PA to see if I can get coverage for Demerol? They might cover it even though she has not tried/failed the Oxy, since she has tried/failed the other two.

## 2012-10-19 NOTE — Telephone Encounter (Signed)
Pt called to check status of prior auth. I explained that I had spoken to ins yesterday and they faxed me a list of preferred medications along w/PA form. Among preferred med was Oxycodone which Dr Merla Riches was going to try to switch her to in 2012, but at the time PA for that was denied, but PA for Demerol was approved. Pt stated if she has to try the Oxy first, she will but please send in new Rx for phenergan also. Pt prefers to stay on Demerol if they will approve.  I went ahead and called [employer] per pt preference to try to get Demerol approved and completed questions over the phone. It has gone for expedited review (w/in 24 hrs) but also asked rep to put in notes that pt needs to try to get this Rx by 5:00 today, and was told that the pt will receive a call concerning decision and a fax will be sent to [place].  Dr Merla Riches, [place] Childes.

## 2012-10-20 NOTE — Telephone Encounter (Signed)
They do this every year/happy to give her Demerol

## 2012-10-22 NOTE — Telephone Encounter (Signed)
Checked on status of approval this am and verified that PA for Demerol has been approved through 10/19/13. Faxed approval notice to pharmacy and notified pt who thanked [place].

## 2012-10-31 ENCOUNTER — Ambulatory Visit (INDEPENDENT_AMBULATORY_CARE_PROVIDER_SITE_OTHER): Payer: Medicare Other | Admitting: Internal Medicine

## 2012-10-31 ENCOUNTER — Encounter: Payer: Self-pay | Admitting: Internal Medicine

## 2012-10-31 VITALS — BP 116/72 | HR 85 | Temp 98.2°F | Resp 16 | Ht 69.0 in | Wt 182.8 lb

## 2012-10-31 DIAGNOSIS — M791 Myalgia, unspecified site: Secondary | ICD-10-CM

## 2012-10-31 DIAGNOSIS — IMO0001 Reserved for inherently not codable concepts without codable children: Secondary | ICD-10-CM | POA: Diagnosis not present

## 2012-10-31 DIAGNOSIS — L0889 Other specified local infections of the skin and subcutaneous tissue: Secondary | ICD-10-CM

## 2012-10-31 DIAGNOSIS — G8929 Other chronic pain: Secondary | ICD-10-CM | POA: Diagnosis not present

## 2012-10-31 DIAGNOSIS — F339 Major depressive disorder, recurrent, unspecified: Secondary | ICD-10-CM

## 2012-10-31 DIAGNOSIS — E785 Hyperlipidemia, unspecified: Secondary | ICD-10-CM | POA: Diagnosis not present

## 2012-10-31 DIAGNOSIS — K130 Diseases of lips: Secondary | ICD-10-CM

## 2012-10-31 DIAGNOSIS — E039 Hypothyroidism, unspecified: Secondary | ICD-10-CM | POA: Diagnosis not present

## 2012-10-31 DIAGNOSIS — M542 Cervicalgia: Secondary | ICD-10-CM | POA: Diagnosis not present

## 2012-10-31 DIAGNOSIS — R635 Abnormal weight gain: Secondary | ICD-10-CM

## 2012-10-31 LAB — CBC WITH DIFFERENTIAL/PLATELET
Basophils Relative: 1 % (ref 0–1)
Eosinophils Absolute: 0.2 10*3/uL (ref 0.0–0.7)
HCT: 39.7 % (ref 36.0–46.0)
Hemoglobin: 13.9 g/dL (ref 12.0–15.0)
Lymphs Abs: 3 10*3/uL (ref 0.7–4.0)
MCH: 30.3 pg (ref 26.0–34.0)
MCHC: 35 g/dL (ref 30.0–36.0)
MCV: 86.7 fL (ref 78.0–100.0)
Monocytes Absolute: 0.5 10*3/uL (ref 0.1–1.0)
Monocytes Relative: 8 % (ref 3–12)

## 2012-10-31 LAB — COMPREHENSIVE METABOLIC PANEL
BUN: 6 mg/dL (ref 6–23)
CO2: 29 mEq/L (ref 19–32)
Creat: 0.91 mg/dL (ref 0.50–1.10)
Glucose, Bld: 83 mg/dL (ref 70–99)
Sodium: 139 mEq/L (ref 135–145)
Total Bilirubin: 0.3 mg/dL (ref 0.3–1.2)
Total Protein: 7.5 g/dL (ref 6.0–8.3)

## 2012-10-31 LAB — LIPID PANEL
HDL: 42 mg/dL (ref >39)
LDL Cholesterol: 114 mg/dL — ABNORMAL HIGH (ref 0–99)
Total CHOL/HDL Ratio: 5.1 Ratio
Triglycerides: 289 mg/dL — ABNORMAL HIGH (ref <150)
VLDL: 58 mg/dL — ABNORMAL HIGH (ref 0–40)

## 2012-10-31 LAB — TSH: TSH: 3.475 u[IU]/mL (ref 0.350–4.500)

## 2012-10-31 MED ORDER — MEPERIDINE HCL 100 MG PO TABS: 100.0000 mg | ORAL_TABLET | Freq: Four times a day (QID) | ORAL | Status: DC | PRN

## 2012-10-31 MED ORDER — MELOXICAM 15 MG PO TABS: 15.0000 mg | ORAL_TABLET | Freq: Every day | ORAL | Status: DC

## 2012-10-31 MED ORDER — CYCLOBENZAPRINE HCL 10 MG PO TABS: 10.0000 mg | ORAL_TABLET | Freq: Three times a day (TID) | ORAL | Status: DC | PRN

## 2012-10-31 MED ORDER — CLOTRIMAZOLE-BETAMETHASONE 1-0.05 % EX CREA: TOPICAL_CREAM | Freq: Two times a day (BID) | CUTANEOUS | Status: DC

## 2012-10-31 NOTE — Progress Notes (Signed)
Subjective:    Patient ID: [name], female    DOB: [birth year], 57 y.o.   MRN: [id number]  HPI here for followup of chronic problems #1 chronic neck pain and chronic low back pain as well as other arthralgias #2  Hypothyroidism #3 recurrent depression with history of PTSD #4 hyperlipidemia  Doing fairly well. Mother has moved home after 6 months in rehabilitation. Stress level about the same Need for pain control the same/still does well over time with Demerol/insurance still requires preauthorization which is often difficult. In recent months she has had lots of myalgias in her big muscles.  Her new complaint is a persistent irritated area at the each corner of the mouth  Review of Systems No fever chills or night sweats No weight loss/in fact she has gain weight Remains sedentary No shortness of breath Chest pain palpitations or edema No GI distress No GU changes No headaches or dizziness    Objective:   Physical Exam BP 116/72  Pulse 85  Temp(Src) 98.2 F (36.8 C) (Oral)  Resp 16  Ht 5\' 9"  (1.753 m)  Wt 182 lb 12.8 oz (82.918 kg)  BMI 26.98 kg/m2  SpO2 94% Neck has a restricted range of motion due to pain No definite neurological changes in the hands although there is a history of carpal tunnel syndrome and carpometacarpal arthritis No thyromegaly or nodules Straight leg raise to 90 intact Skin reveals fissures at the corner of the mouth      Assessment & Plan:  Chronic pain - Myalgia - Neck pain on right side - Unspecified hypothyroidism - [place]le[place] Other and unspecified hyperlipidemia - Abnormal weight gain  Plan refill Demerol/recheck thyroid/recheck lipids/treat mouth with Lotrisone     /Meds ordered this encounter  Medications  . meperidine (DEMEROL) 100 MG tablet    Sig: Take 1 tablet (100 mg total) by mouth every 6 (six) hours as needed for pain.    Dispense:  120 tablet    Refill:  0  . meperidine (DEMEROL) 100 MG tablet    Sig: Take 1  tablet (100 mg total) by mouth every 6 (six) hours as needed for pain. For after 08/02/12    Dispense:  120 tablet    Refill:  0  . meperidine (DEMEROL) 100 MG tablet    Sig: Take 1 tablet (100 mg total) by mouth every 6 (six) hours as needed for pain.    Dispense:  120 tablet    Refill:  0  . cyclobenzaprine (FLEXERIL) 10 MG tablet    Sig: Take 1 tablet (10 mg total) by mouth 3 (three) times daily as needed for muscle spasms.    Dispense:  90 tablet    Refill:  2  . clotrimazole-betamethasone (LOTRISONE) cream    Sig: Apply topically 2 (two) times daily.    Dispense:  15 g    Refill:  0  . meloxicam (MOBIC) 15 MG tablet    Sig: Take 1 tablet (15 mg total) by mouth daily. For shoulder and hip    Dispense:  30 tablet    Refill:  0   Results for orders placed in visit on 10/31/12  CBC WITH DIFFERENTIAL      Result Value Range   WBC 6.2  4.0 - 10.5 K/uL   RBC 4.58  3.87 - 5.11 MIL/uL   Hemoglobin 13.9  12.0 - 15.0 g/dL   HCT 40.9  81.1 - 91.4 %   MCV 86.7  78.0 - 100.0 fL  MCH 30.3  26.0 - 34.0 pg   MCHC 35.0  30.0 - 36.0 g/dL   RDW 16.1  09.6 - 04.5 %   Platelets 288  150 - 400 K/uL   Neutrophils Relative 40 (*) 43 - 77 %   Neutro Abs 2.5  1.7 - 7.7 K/uL   Lymphocytes Relative 48 (*) 12 - 46 %   Lymphs Abs 3.0  0.7 - 4.0 K/uL   Monocytes Relative 8  3 - 12 %   Monocytes Absolute 0.5  0.1 - 1.0 K/uL   Eosinophils Relative 3  0 - 5 %   Eosinophils Absolute 0.2  0.0 - 0.7 K/uL   Basophils Relative 1  0 - 1 %   Basophils Absolute 0.1  0.0 - 0.1 K/uL   Smear Review Criteria for review not met    LIPID PANEL      Result Value Range   Cholesterol 214 (*) 0 - 200 mg/dL   Triglycerides 409 (*) <150 mg/dL   HDL 42  >81 mg/dL   Total CHOL/HDL Ratio 5.1     VLDL 58 (*) 0 - 40 mg/dL   LDL Cholesterol 191 (*) 0 - 99 mg/dL  TSH      Result Value Range   TSH 3.475  0.350 - 4.500 uIU/mL  T4, FREE      Result Value Range   Free T4 1.07  0.80 - 1.80 ng/dL  CK      Result Value  Range   Total CK 79  7 - 177 U/L  COMPREHENSIVE METABOLIC PANEL      Result Value Range   Sodium 139  135 - 145 mEq/L   Potassium 3.8  3.5 - 5.3 mEq/L   Chloride 101  96 - 112 mEq/L   CO2 29  19 - 32 mEq/L   Glucose, Bld 83  70 - 99 mg/dL   BUN 6  6 - 23 mg/dL   Creat 4.78  2.95 - 6.21 mg/dL   Total Bilirubin 0.3  0.3 - 1.2 mg/dL   Alkaline Phosphatase 87  39 - 117 U/L   AST 24  0 - 37 U/L   ALT 30  0 - 35 U/L   Total Protein 7.5  6.0 - 8.3 g/dL   Albumin 4.7  3.5 - 5.2 g/dL   Calcium 9.6  8.4 - 30.8 mg/dL   Refill Lipitor and Synthroid Start yoga Recheck in 3 months

## 2012-11-01 DIAGNOSIS — H40019 Open angle with borderline findings, low risk, unspecified eye: Secondary | ICD-10-CM | POA: Diagnosis not present

## 2012-11-01 DIAGNOSIS — E0500 Thyrotoxicosis with diffuse goiter without thyrotoxic crisis or storm: Secondary | ICD-10-CM | POA: Diagnosis not present

## 2012-11-05 ENCOUNTER — Encounter: Payer: Self-pay | Admitting: Internal Medicine

## 2012-11-08 ENCOUNTER — Encounter: Payer: Self-pay | Admitting: Internal Medicine

## 2013-01-19 ENCOUNTER — Other Ambulatory Visit: Payer: Self-pay | Admitting: Physician Assistant

## 2013-02-06 ENCOUNTER — Ambulatory Visit (INDEPENDENT_AMBULATORY_CARE_PROVIDER_SITE_OTHER): Payer: Medicare Other | Admitting: Internal Medicine

## 2013-02-06 ENCOUNTER — Encounter: Payer: Self-pay | Admitting: Internal Medicine

## 2013-02-06 VITALS — BP 116/74 | HR 86 | Temp 98.4°F | Resp 16 | Ht 68.5 in | Wt 179.8 lb

## 2013-02-06 DIAGNOSIS — M542 Cervicalgia: Secondary | ICD-10-CM | POA: Diagnosis not present

## 2013-02-06 DIAGNOSIS — F411 Generalized anxiety disorder: Secondary | ICD-10-CM | POA: Diagnosis not present

## 2013-02-06 DIAGNOSIS — G8929 Other chronic pain: Secondary | ICD-10-CM | POA: Diagnosis not present

## 2013-02-06 DIAGNOSIS — F4310 Post-traumatic stress disorder, unspecified: Secondary | ICD-10-CM

## 2013-02-06 DIAGNOSIS — F419 Anxiety disorder, unspecified: Secondary | ICD-10-CM

## 2013-02-06 MED ORDER — CYCLOBENZAPRINE HCL 10 MG PO TABS: 10.0000 mg | ORAL_TABLET | Freq: Three times a day (TID) | ORAL | Status: DC | PRN

## 2013-02-06 MED ORDER — LEVOTHYROXINE SODIUM 50 MCG PO TABS: ORAL_TABLET | ORAL | Status: DC

## 2013-02-06 MED ORDER — AMITRIPTYLINE HCL 100 MG PO TABS: 100.0000 mg | ORAL_TABLET | Freq: Every day | ORAL | Status: DC

## 2013-02-06 MED ORDER — ATORVASTATIN CALCIUM 40 MG PO TABS: 40.0000 mg | ORAL_TABLET | Freq: Every day | ORAL | Status: DC

## 2013-02-06 MED ORDER — MEPERIDINE HCL 100 MG PO TABS: 100.0000 mg | ORAL_TABLET | Freq: Four times a day (QID) | ORAL | Status: DC | PRN

## 2013-02-06 MED ORDER — DIAZEPAM 5 MG PO TABS: 5.0000 mg | ORAL_TABLET | Freq: Four times a day (QID) | ORAL | Status: DC | PRN

## 2013-02-06 MED ORDER — MELOXICAM 15 MG PO TABS: 15.0000 mg | ORAL_TABLET | Freq: Every day | ORAL | Status: DC

## 2013-02-07 NOTE — Progress Notes (Signed)
Subjective:    Patient ID: Lenice Pressman, female    DOB: March 29, 1956, 57 y.o.   MRN: [id number]  HPIf/u Patient Active Problem List   Diagnosis Date Noted  . Allergic rhinitis due to pollen 10/26/2011  . Chronic neck pain 10/26/2011  . CMC arthritis 10/26/2011  . CTS (carpal tunnel syndrome) 10/26/2011  . PTSD (post-traumatic stress disorder) 10/26/2011  . Hyperlipemia 10/26/2011  . Hearing loss in right ear 10/26/2011  . GRAVES DISEASE 10/26/2006  . DEPRESSION, MAJOR, RECURRENT 10/26/2006  . OTITIS EXTERNA, UNSPECIFIED 10/26/2006    Neck pain being worse with only fair control her pain medicines and muscle relaxers Would like to try physical therapy especially massage again  Labs stable 3/14 Needs refills Mom worsening dementia Depression stable on amitriptyline/anxiety responding to Valium Allergy stable CMC arthritis distressing/MOBIC helpful    Review of Systems Noncontributory other than present illness    Objective:   Physical Exam BP 116/74  Pulse 86  Temp(Src) 98.4 F (36.9 C) (Oral)  Resp 16  Ht 5' 8.5" (1.74 m)  Wt 179 lb 12.8 oz (81.557 kg)  BMI 26.94 kg/m2  SpO2 97% HEENT clear Neck range of motion decreased with tenderness in the posterior cervical and trapezii areas  No mtor or sensory losses in the upper extremities       Assessment & Plan:  Anxiety -depression- PTSD  Plan: Refill meds  Chronic neck pain - Plan: Ambulatory referral to Physical Therapy to include massage,? Electrical stimulation , among other modalities  Hyperlipidemia Hypothyroidism CMC arthritis  Meds ordered this encounter  Medications  . levothyroxine (SYNTHROID, LEVOTHROID) 50 MCG tablet    Sig: TAKE ONE TABLET EVERY MORNING    Dispense:  90 tablet    Refill:  2  . amitriptyline (ELAVIL) 100 MG tablet    Sig: Take 1 tablet (100 mg total) by mouth at bedtime.    Dispense:  30 tablet    Refill:  5  . meloxicam (MOBIC) 15 MG tablet    Sig: Take 1 tablet (15 mg  total) by mouth daily. For shoulder and hip    Dispense:  90 tablet    Refill:  3  . atorvastatin (LIPITOR) 40 MG tablet    Sig: Take 1 tablet (40 mg total) by mouth daily.    Dispense:  90 tablet    Refill:  1  . diazepam (VALIUM) 5 MG tablet    Sig: Take 1 tablet (5 mg total) by mouth every 6 (six) hours as needed.    Dispense:  120 tablet    Refill:  5  . cyclobenzaprine (FLEXERIL) 10 MG tablet    Sig: Take 1 tablet (10 mg total) by mouth 3 (three) times daily as needed for muscle spasms.    Dispense:  90 tablet    Refill:  3  . meperidine (DEMEROL) 100 MG tablet    Sig: Take 1 tablet (100 mg total) by mouth every 6 (six) hours as needed for pain. 04/08/13    Dispense:  120 tablet    Refill:  0  . meperidine (DEMEROL) 100 MG tablet    Sig: Take 1 tablet (100 mg total) by mouth every 6 (six) hours as needed for pain.    Dispense:  120 tablet    Refill:  0  . meperidine (DEMEROL) 100 MG tablet    Sig: Take 1 tablet (100 mg total) by mouth every 6 (six) hours as needed for pain. 03/08/13    Dispense:  120  tablet    Refill:  0   May call in 3 months for refills and followup in 6 months

## 2013-02-18 ENCOUNTER — Other Ambulatory Visit: Payer: Self-pay | Admitting: Physician Assistant

## 2013-02-18 DIAGNOSIS — M6281 Muscle weakness (generalized): Secondary | ICD-10-CM | POA: Diagnosis not present

## 2013-02-18 DIAGNOSIS — M542 Cervicalgia: Secondary | ICD-10-CM | POA: Diagnosis not present

## 2013-02-26 DIAGNOSIS — M542 Cervicalgia: Secondary | ICD-10-CM | POA: Diagnosis not present

## 2013-03-12 DIAGNOSIS — M542 Cervicalgia: Secondary | ICD-10-CM | POA: Diagnosis not present

## 2013-05-02 ENCOUNTER — Telehealth: Payer: Self-pay

## 2013-05-02 MED ORDER — MEPERIDINE HCL 100 MG PO TABS: 100.0000 mg | ORAL_TABLET | Freq: Four times a day (QID) | ORAL | Status: DC | PRN

## 2013-05-02 NOTE — Telephone Encounter (Signed)
Results for orders placed in visit on 10/31/12  CBC WITH DIFFERENTIAL      Result Value Range   WBC 6.2  4.0 - 10.5 K/uL   RBC 4.58  3.87 - 5.11 MIL/uL   Hemoglobin 13.9  12.0 - 15.0 g/dL   HCT 16.1  09.6 - 04.5 %   MCV 86.7  78.0 - 100.0 fL   MCH 30.3  26.0 - 34.0 pg   MCHC 35.0  30.0 - 36.0 g/dL   RDW 40.9  81.1 - 91.4 %   Platelets 288  150 - 400 K/uL   Neutrophils Relative % 40 (*) 43 - 77 %   Neutro Abs 2.5  1.7 - 7.7 K/uL   Lymphocytes Relative 48 (*) 12 - 46 %   Lymphs Abs 3.0  0.7 - 4.0 K/uL   Monocytes Relative 8  3 - 12 %   Monocytes Absolute 0.5  0.1 - 1.0 K/uL   Eosinophils Relative 3  0 - 5 %   Eosinophils Absolute 0.2  0.0 - 0.7 K/uL   Basophils Relative 1  0 - 1 %   Basophils Absolute 0.1  0.0 - 0.1 K/uL   Smear Review Criteria for review not met    LIPID PANEL      Result Value Range   Cholesterol 214 (*) 0 - 200 mg/dL   Triglycerides 782 (*) <150 mg/dL   HDL 42  >95 mg/dL   Total CHOL/HDL Ratio 5.1     VLDL 58 (*) 0 - 40 mg/dL   LDL Cholesterol 621 (*) 0 - 99 mg/dL  TSH      Result Value Range   TSH 3.475  0.350 - 4.500 uIU/mL  T4, FREE      Result Value Range   Free T4 1.07  0.80 - 1.80 ng/dL  CK      Result Value Range   Total CK 79  7 - 177 U/L  COMPREHENSIVE METABOLIC PANEL      Result Value Range   Sodium 139  135 - 145 mEq/L   Potassium 3.8  3.5 - 5.3 mEq/L   Chloride 101  96 - 112 mEq/L   CO2 29  19 - 32 mEq/L   Glucose, Bld 83  70 - 99 mg/dL   BUN 6  6 - 23 mg/dL   Creat 3.08  6.57 - 8.46 mg/dL   Total Bilirubin 0.3  0.3 - 1.2 mg/dL   Alkaline Phosphatase 87  39 - 117 U/L   AST 24  0 - 37 U/L   ALT 30  0 - 35 U/L   Total Protein 7.5  6.0 - 8.3 g/dL   Albumin 4.7  3.5 - 5.2 g/dL   Calcium 9.6  8.4 - 96.2 mg/dL

## 2013-05-02 NOTE — Telephone Encounter (Signed)
PT STATES IT IS TIME FOR HER TO COME BY AND PICK UP HER PAIN MEDICINE. PLEASE CALL [phone]

## 2013-05-02 NOTE — Telephone Encounter (Signed)
Please advise on refill of Demerol

## 2013-05-02 NOTE — Telephone Encounter (Signed)
What medication is she requesting.?

## 2013-05-03 NOTE — Telephone Encounter (Signed)
Patient advised Rx ready for pick up.

## 2013-07-30 ENCOUNTER — Telehealth: Payer: Self-pay

## 2013-07-30 MED ORDER — MEPERIDINE HCL 100 MG PO TABS: 100.0000 mg | ORAL_TABLET | Freq: Four times a day (QID) | ORAL | Status: DC | PRN

## 2013-07-30 NOTE — Telephone Encounter (Signed)
Please advise 

## 2013-07-30 NOTE — Telephone Encounter (Signed)
Meds ordered this encounter  Medications  . meperidine (DEMEROL) 100 MG tablet    Sig: Take 1 tablet (100 mg total) by mouth every 6 (six) hours as needed for pain. 10/08/13    Dispense:  120 tablet    Refill:  0  . meperidine (DEMEROL) 100 MG tablet    Sig: Take 1 tablet (100 mg total) by mouth every 6 (six) hours as needed for pain. 08/07/13    Dispense:  120 tablet    Refill:  0  . meperidine (DEMEROL) 100 MG tablet    Sig: Take 1 tablet (100 mg total) by mouth every 6 (six) hours as needed for pain. 09/07/13    Dispense:  120 tablet    Refill:  0

## 2013-07-30 NOTE — Telephone Encounter (Signed)
Patient needs a refill on Demerol.   [phone]

## 2013-07-31 NOTE — Telephone Encounter (Signed)
Advised patient Rx ready for pick up .  

## 2013-08-14 ENCOUNTER — Ambulatory Visit (INDEPENDENT_AMBULATORY_CARE_PROVIDER_SITE_OTHER): Payer: Medicare Other | Admitting: Internal Medicine

## 2013-08-14 VITALS — BP 126/76 | HR 87 | Temp 97.8°F | Resp 16 | Ht 68.5 in | Wt 178.0 lb

## 2013-08-14 DIAGNOSIS — E0500 Thyrotoxicosis with diffuse goiter without thyrotoxic crisis or storm: Secondary | ICD-10-CM

## 2013-08-14 DIAGNOSIS — F339 Major depressive disorder, recurrent, unspecified: Secondary | ICD-10-CM

## 2013-08-14 DIAGNOSIS — E785 Hyperlipidemia, unspecified: Secondary | ICD-10-CM

## 2013-08-14 DIAGNOSIS — F419 Anxiety disorder, unspecified: Secondary | ICD-10-CM

## 2013-08-14 DIAGNOSIS — F411 Generalized anxiety disorder: Secondary | ICD-10-CM | POA: Diagnosis not present

## 2013-08-14 DIAGNOSIS — G8929 Other chronic pain: Secondary | ICD-10-CM

## 2013-08-14 DIAGNOSIS — M542 Cervicalgia: Secondary | ICD-10-CM

## 2013-08-14 DIAGNOSIS — F4310 Post-traumatic stress disorder, unspecified: Secondary | ICD-10-CM | POA: Diagnosis not present

## 2013-08-14 MED ORDER — MEPERIDINE HCL 100 MG PO TABS: 100.0000 mg | ORAL_TABLET | Freq: Four times a day (QID) | ORAL | Status: DC | PRN

## 2013-08-14 MED ORDER — AMITRIPTYLINE HCL 100 MG PO TABS: 100.0000 mg | ORAL_TABLET | Freq: Every day | ORAL | Status: DC

## 2013-08-14 MED ORDER — DIAZEPAM 5 MG PO TABS: 5.0000 mg | ORAL_TABLET | Freq: Four times a day (QID) | ORAL | Status: DC | PRN

## 2013-08-14 MED ORDER — LEVOTHYROXINE SODIUM 50 MCG PO TABS: ORAL_TABLET | ORAL | Status: DC

## 2013-08-14 MED ORDER — ATORVASTATIN CALCIUM 40 MG PO TABS: 40.0000 mg | ORAL_TABLET | Freq: Every day | ORAL | Status: DC

## 2013-08-14 NOTE — Progress Notes (Addendum)
Subjective:    Patient ID: [name], female    DOB: 1955/09/26, 57 y.o.   MRN: [id number]  HPIf/u Patient Active Problem List   Diagnosis Date Noted  . Allergic rhinitis due to pollen 10/26/2011  . Chronic neck pain 10/26/2011  . CMC arthritis 10/26/2011  . CTS (carpal tunnel syndrome) 10/26/2011  . PTSD (post-traumatic stress disorder) 10/26/2011  . Hyperlipemia 10/26/2011  . Hearing loss in right ear 10/26/2011  . GRAVES DISEASE 10/26/2006  . DEPRESSION, MAJOR, RECURRENT 10/26/2006  . OTITIS EXTERNA, UNSPECIFIED 10/26/2006  Current outpatient prescriptions:amitriptyline (ELAVIL) 100 MG tablet, Take 1 tablet (100 mg total) by mouth at bedtime., Disp: 30 tablet, Rfl: 5;  aspirin 81 MG tablet, Take 81 mg by mouth daily., Disp: , Rfl: ;  atorvastatin (LIPITOR) 40 MG tablet, Take 1 tablet (40 mg total) by mouth daily., Disp: 90 tablet, Rfl: 1;  clotrimazole-betamethasone (LOTRISONE) cream, Apply topically 2 (two) times daily. NEED REFILLS, Disp: , Rfl:  cyclobenzaprine (FLEXERIL) 10 MG tablet, Take 1 tablet (10 mg total) by mouth 3 (three) times daily as needed for muscle spasms., Disp: 90 tablet, Rfl: 3;  diazepam (VALIUM) 5 MG tablet, Take 1 tablet (5 mg total) by mouth every 6 (six) hours as needed., Disp: 120 tablet, Rfl: 5;  levothyroxine (SYNTHROID, LEVOTHROID) 50 MCG tablet, TAKE ONE TABLET EVERY MORNING, Disp: 90 tablet, Rfl: 1 meloxicam (MOBIC) 15 MG tablet, Take 1 tablet (15 mg total) by mouth daily. For shoulder and hip, Disp: 90 tablet, Rfl: 3;  meperidine (DEMEROL) 100 MG tablet, Take 1 tablet (100 mg total) by mouth every 6 (six) hours as needed for pain. 11/05/13, Disp: 120 tablet, Rfl: 0;  NASONEX 50 MCG/ACT nasal spray, TWO SPRAYS DAILY INTO NOSE, Disp: 17 g, Rfl: 11;  promethazine (PHENERGAN) 25 MG tablet, Take 25 mg by mouth as needed., Disp: , Rfl:  meperidine (DEMEROL) 100 MG tablet, Take 1 tablet (100 mg total) by mouth every 6 (six) hours as needed for pain. 12/06/12,  Disp: 120 tablet, Rfl: 0;  meperidine (DEMEROL) 100 MG tablet, Take 1 tablet (100 mg total) by mouth every 6 (six) hours as needed for pain. 01/05/14, Disp: 120 tablet, Rfl: 0    Stressed-caring for Mom/other family issues Sleeping well but depressed more than usual This time of year bad-holidays-fam dysfunc Trying to meditate Neck therapy only so so 6 visits-[address]--better tho not well meds fair-never really out of pain   Review of Systems  Constitutional: Negative for fever, activity change, appetite change and unexpected weight change.  Eyes: Negative for visual disturbance.  Respiratory: Negative for shortness of breath.   Cardiovascular: Negative for chest pain, palpitations and leg swelling.  Gastrointestinal: Negative for abdominal pain.  Neurological: Negative for syncope and headaches.  Psychiatric/Behavioral: Negative for suicidal ideas and self-injury.       Objective:   Physical Exam  Nursing note and vitals reviewed. Constitutional: She is oriented to person, place, and time. She appears well-developed and well-nourished. No distress.  HENT:  Head: Normocephalic and atraumatic.  Eyes: Pupils are equal, round, and reactive to light.  Neck: Normal range of motion. No thyromegaly present.  decr rom due to pain  Cardiovascular: Normal rate and regular rhythm.   Pulmonary/Chest: Effort normal. No respiratory distress.  Musculoskeletal: Normal range of motion. She exhibits no edema.  Lymphadenopathy:    She has no cervical adenopathy.  Neurological: She is alert and oriented to person, place, and time. She has normal reflexes. No cranial nerve  deficit.  Skin: Skin is warm and dry.  Psychiatric: Her behavior is normal. Judgment and thought content normal.   BP 126/76  Pulse 87  Temp(Src) 97.8 F (36.6 C)  Resp 16  Ht 5' 8.5" (1.74 m)  Wt 178 lb (80.74 kg)  BMI 26.67 kg/m2        Assessment & Plan:  Anxiety -  PTSD (post-traumatic stress  disorder)  Hyperlipemia  GRAVES DISEASE-hypothyroid  DEPRESSION, MAJOR, RECURRENT  Chronic neck pain   Meds ordered this encounter  Medications  . amitriptyline (ELAVIL) 100 MG tablet    Sig: Take 1 tablet (100 mg total) by mouth at bedtime.    Dispense:  30 tablet    Refill:  5  . meperidine (DEMEROL) 100 MG tablet    Sig: Take 1 tablet (100 mg total) by mouth every 6 (six) hours as needed for pain. 11/05/13    Dispense:  120 tablet    Refill:  0  . meperidine (DEMEROL) 100 MG tablet    Sig: Take 1 tablet (100 mg total) by mouth every 6 (six) hours as needed for pain. 12/06/12    Dispense:  120 tablet    Refill:  0  . meperidine (DEMEROL) 100 MG tablet    Sig: Take 1 tablet (100 mg total) by mouth every 6 (six) hours as needed for pain. 01/05/14    Dispense:  120 tablet    Refill:  0  . atorvastatin (LIPITOR) 40 MG tablet    Sig: Take 1 tablet (40 mg total) by mouth daily.    Dispense:  90 tablet    Refill:  1  . diazepam (VALIUM) 5 MG tablet    Sig: Take 1 tablet (5 mg total) by mouth every 6 (six) hours as needed.    Dispense:  120 tablet    Refill:  5  . levothyroxine (SYNTHROID, LEVOTHROID) 50 MCG tablet    Sig: TAKE ONE TABLET EVERY MORNING    Dispense:  90 tablet    Refill:  1   F/u w/ labs 6 mos

## 2013-08-24 ENCOUNTER — Other Ambulatory Visit: Payer: Self-pay | Admitting: Internal Medicine

## 2013-09-24 ENCOUNTER — Telehealth: Payer: Self-pay

## 2013-09-24 NOTE — Telephone Encounter (Signed)
Patient states that she needs a refill on Flexeril. [facility] on [address]  [phone]

## 2013-09-25 MED ORDER — CYCLOBENZAPRINE HCL 10 MG PO TABS: 10.0000 mg | ORAL_TABLET | Freq: Three times a day (TID) | ORAL | Status: DC | PRN

## 2013-09-25 NOTE — Telephone Encounter (Signed)
Ok to refill Flexeril? I have pended medication and correct pharmacy.

## 2013-09-26 NOTE — Telephone Encounter (Signed)
Pt.notified

## 2013-11-07 ENCOUNTER — Telehealth: Payer: Self-pay

## 2013-11-07 NOTE — Telephone Encounter (Signed)
Patient is calling with questions about her pain medication [phone]

## 2013-11-08 MED ORDER — HYDROMORPHONE HCL 2 MG PO TABS: 2.0000 mg | ORAL_TABLET | Freq: Four times a day (QID) | ORAL | Status: DC | PRN

## 2013-11-08 NOTE — Telephone Encounter (Signed)
Pt is unable to get her pain medication filled due to back order. Can she get Dilaudid prescription until next Thursday?

## 2013-11-08 NOTE — Telephone Encounter (Signed)
No voicemail set up 

## 2013-11-11 NOTE — Telephone Encounter (Signed)
Notified pt Rx ready for p/up 

## 2013-11-12 ENCOUNTER — Telehealth: Payer: Self-pay

## 2013-11-12 NOTE — Telephone Encounter (Signed)
Pt called back and reported that she has tried flonase in past and she doesn't think it worked as well as nasonex. Completed PA on covermymeds.

## 2013-11-12 NOTE — Telephone Encounter (Signed)
PA needed for Nasonex. LMOM for pt to CB w. Info about what other meds have been tried in past. Paper chart Vol II shows pt on Nasonex for several years.

## 2013-11-20 NOTE — Telephone Encounter (Signed)
PA for Nasonex was denied because pt has to have tried/failed at least two preferred alternatives. Pt has tried fluticasone and felt that it did not work as well as the Nasonex. The other alternatives are Azelastine, and flunisolide. Dr Laney Pastor do you want to Rx one of these?

## 2013-11-20 NOTE — Telephone Encounter (Signed)
Flunisolide

## 2013-11-20 NOTE — Telephone Encounter (Signed)
Pt called to check status of PA on Nasonex. They did not have case started even though she could see that it was sent. Completed PA on phone and asked for expedited review. Case # [id number].  Pt had also reported that she got a letter from [employer] stating they will not cover Demerol anymore. PA from last year exp 09/2013. Provided info on this PA as well and it has gone to review, case # [id number].  Notified pt of status on both PAs.

## 2013-11-21 MED ORDER — FLUNISOLIDE 29 MCG/ACT NA SOLN: 2.0000 | Freq: Two times a day (BID) | NASAL | Status: DC

## 2013-11-21 NOTE — Telephone Encounter (Signed)
Sent in Rx and advised pt of new NS Rx. Advised her to try and if flunisolide is not effective to CB and we can try the PA again since she will have tried two alternatives at that point. Pt agreed.

## 2013-12-02 DIAGNOSIS — H40019 Open angle with borderline findings, low risk, unspecified eye: Secondary | ICD-10-CM | POA: Diagnosis not present

## 2013-12-02 DIAGNOSIS — E0500 Thyrotoxicosis with diffuse goiter without thyrotoxic crisis or storm: Secondary | ICD-10-CM | POA: Diagnosis not present

## 2013-12-26 ENCOUNTER — Telehealth: Payer: Self-pay

## 2013-12-26 NOTE — Telephone Encounter (Signed)
Pt states that she is current using the nasal spray FLUNISOLIDE and it does not seem to be working for her, she states that she is still congested. She would like Nasonex instead, however her insurance will not cover it she would like to know if there is a way we can get this covered for her since it works best. [employer]  best# [phone]

## 2013-12-27 NOTE — Telephone Encounter (Signed)
Completed PA on covermymeds. Rx Ins info on Snapshot.

## 2014-01-03 ENCOUNTER — Telehealth: Payer: Self-pay

## 2014-01-03 NOTE — Telephone Encounter (Signed)
[name] is a pt of Dr.Doolittle, her pharmacy is on back order for her normal prescription of delata (s/p?) wants to know if they can send in [place] (s/p) like done previous.

## 2014-01-03 NOTE — Telephone Encounter (Signed)
Demerol instead of Dilaudid

## 2014-01-05 ENCOUNTER — Telehealth: Payer: Self-pay

## 2014-01-05 NOTE — Telephone Encounter (Signed)
Pt states that no pharmacy has her demerol and the one that she usually go to has only 20 tabs. Pt really needs medication and will use her rx for 120 tabs to get only the 20 tabs.  She will need a rx for the 100 rewritten because when she get the 20 tabs it will void out the 100 tabs.  Can we write that for her to pickup?  Advised pt that Dr. Laney Pastor was out until Wed.  Pt will be okay with the 20 till then.

## 2014-01-05 NOTE — Telephone Encounter (Signed)
PATIENT CALLED STATING SHE IS COMPLETELY OUT OF MEDICATION AND PHARMACY ONLY HAS 20 PILLS. SHE SAYS SHE NEEDS SOMETHING TO TAKE AND WANTS TO KNOW IF WE CAN WRITE A PRESCRIPTION FOR 20 PILLS INSTEAD OF THE 120 THAT SHE CURRENTLY HAS.

## 2014-01-06 NOTE — Telephone Encounter (Signed)
Ok to settle this for her based on what we've done in past--ask any PA to sign if needed before wednesday

## 2014-01-07 NOTE — Telephone Encounter (Signed)
Do I need to do anything with this?  It sounds like she is picking up #20 tabs, and she will need a new rx for #100 which Dr. Laney Pastor can sign when he is in the office tomorrow - is that correct?

## 2014-01-08 MED ORDER — MEPERIDINE HCL 100 MG PO TABS: 100.0000 mg | ORAL_TABLET | Freq: Four times a day (QID) | ORAL | Status: DC | PRN

## 2014-01-08 NOTE — Telephone Encounter (Signed)
Patient states that she no longer needs the RX for 100 tabs.  The pharmacy called and told her the medication came in so they were able to fill the previous RX.  Called to [address] and spoke to [place] to destroy the RX.

## 2014-01-08 NOTE — Telephone Encounter (Signed)
Signed 100# to follow the #20

## 2014-01-09 NOTE — Telephone Encounter (Signed)
Checked status w/[employer] and was told it had gone to Appeals but was approved through 11/23/14. Notified pharm and pt.

## 2014-01-31 ENCOUNTER — Telehealth: Payer: Self-pay

## 2014-01-31 NOTE — Telephone Encounter (Signed)
meperidine (DEMEROL) 100 MG tablet  Patient will run out before her next appointment with Dr. Laney Pastor   [phone]

## 2014-02-03 MED ORDER — MEPERIDINE HCL 100 MG PO TABS: 100.0000 mg | ORAL_TABLET | Freq: Four times a day (QID) | ORAL | Status: DC | PRN

## 2014-02-03 NOTE — Telephone Encounter (Signed)
On chart review, it looks like this was printed by Dr. Laney Pastor today - please find out if this was signed or not

## 2014-02-03 NOTE — Telephone Encounter (Signed)
Patient called about this last week.  She says [facility] usually fills it for her if Laney Pastor is not here.

## 2014-02-04 MED ORDER — MEPERIDINE HCL 100 MG PO TABS: 100.0000 mg | ORAL_TABLET | Freq: Four times a day (QID) | ORAL | Status: DC | PRN

## 2014-02-04 NOTE — Telephone Encounter (Signed)
Message from Dr. Laney Pastor asks if someone can sign for him? I have not found the prescription at nurses desk.

## 2014-02-04 NOTE — Telephone Encounter (Signed)
Rx printed and signed.  

## 2014-02-04 NOTE — Telephone Encounter (Signed)
Informed pt rx was ready for pick up 

## 2014-02-04 NOTE — Addendum Note (Signed)
Addended by: Theda Sers on: 02/04/2014 12:02 PM   Modules accepted: Orders

## 2014-02-07 NOTE — Telephone Encounter (Signed)
Dr Laney Pastor printed Rx and signed, but pt has already p/up the Rx written by Benjamine Mola on 02/04/14. Shredded Rx by Dr Laney Pastor and notified him.

## 2014-02-12 ENCOUNTER — Encounter: Payer: Self-pay | Admitting: Internal Medicine

## 2014-02-12 ENCOUNTER — Ambulatory Visit (INDEPENDENT_AMBULATORY_CARE_PROVIDER_SITE_OTHER): Payer: Medicare Other | Admitting: Internal Medicine

## 2014-02-12 VITALS — BP 123/84 | HR 85 | Temp 98.1°F | Resp 16 | Ht 68.5 in | Wt 176.0 lb

## 2014-02-12 DIAGNOSIS — F411 Generalized anxiety disorder: Secondary | ICD-10-CM

## 2014-02-12 DIAGNOSIS — G8929 Other chronic pain: Secondary | ICD-10-CM

## 2014-02-12 DIAGNOSIS — E785 Hyperlipidemia, unspecified: Secondary | ICD-10-CM

## 2014-02-12 DIAGNOSIS — E039 Hypothyroidism, unspecified: Secondary | ICD-10-CM

## 2014-02-12 DIAGNOSIS — R1011 Right upper quadrant pain: Secondary | ICD-10-CM

## 2014-02-12 DIAGNOSIS — H8110 Benign paroxysmal vertigo, unspecified ear: Secondary | ICD-10-CM

## 2014-02-12 DIAGNOSIS — F419 Anxiety disorder, unspecified: Secondary | ICD-10-CM

## 2014-02-12 LAB — COMPLETE METABOLIC PANEL WITH GFR
ALK PHOS: 77 U/L (ref 39–117)
ALT: 34 U/L (ref 0–35)
AST: 25 U/L (ref 0–37)
Albumin: 4.7 g/dL (ref 3.5–5.2)
BUN: 9 mg/dL (ref 6–23)
CO2: 28 mEq/L (ref 19–32)
CREATININE: 0.96 mg/dL (ref 0.50–1.10)
Calcium: 9.3 mg/dL (ref 8.4–10.5)
Chloride: 102 mEq/L (ref 96–112)
GFR, EST NON AFRICAN AMERICAN: 64 mL/min
GFR, Est African American: 74 mL/min
Glucose, Bld: 104 mg/dL — ABNORMAL HIGH (ref 70–99)
Potassium: 4.5 mEq/L (ref 3.5–5.3)
Sodium: 138 mEq/L (ref 135–145)
Total Bilirubin: 0.3 mg/dL (ref 0.2–1.2)
Total Protein: 7.5 g/dL (ref 6.0–8.3)

## 2014-02-12 LAB — CBC WITH DIFFERENTIAL/PLATELET
BASOS ABS: 0.1 10*3/uL (ref 0.0–0.1)
BASOS PCT: 1 % (ref 0–1)
EOS ABS: 0.2 10*3/uL (ref 0.0–0.7)
EOS PCT: 4 % (ref 0–5)
HEMATOCRIT: 39 % (ref 36.0–46.0)
HEMOGLOBIN: 13 g/dL (ref 12.0–15.0)
Lymphocytes Relative: 41 % (ref 12–46)
Lymphs Abs: 2.3 10*3/uL (ref 0.7–4.0)
MCH: 29.7 pg (ref 26.0–34.0)
MCHC: 33.3 g/dL (ref 30.0–36.0)
MCV: 89.2 fL (ref 78.0–100.0)
MONOS PCT: 10 % (ref 3–12)
Monocytes Absolute: 0.6 10*3/uL (ref 0.1–1.0)
NEUTROS ABS: 2.5 10*3/uL (ref 1.7–7.7)
Neutrophils Relative %: 44 % (ref 43–77)
Platelets: 259 10*3/uL (ref 150–400)
RBC: 4.37 MIL/uL (ref 3.87–5.11)
RDW: 13.8 % (ref 11.5–15.5)
WBC: 5.7 10*3/uL (ref 4.0–10.5)

## 2014-02-12 LAB — LIPID PANEL
Cholesterol: 150 mg/dL (ref 0–200)
HDL: 46 mg/dL (ref >39)
LDL Cholesterol: 84 mg/dL (ref 0–99)
TRIGLYCERIDES: 102 mg/dL (ref <150)
Total CHOL/HDL Ratio: 3.3 Ratio
VLDL: 20 mg/dL (ref 0–40)

## 2014-02-12 LAB — TSH: TSH: 7.156 u[IU]/mL — AB (ref 0.350–4.500)

## 2014-02-12 LAB — T4, FREE: FREE T4: 0.94 ng/dL (ref 0.80–1.80)

## 2014-02-12 MED ORDER — MEPERIDINE HCL 100 MG PO TABS: 100.0000 mg | ORAL_TABLET | Freq: Four times a day (QID) | ORAL | Status: DC | PRN

## 2014-02-12 MED ORDER — MELOXICAM 15 MG PO TABS: 15.0000 mg | ORAL_TABLET | Freq: Every day | ORAL | Status: DC

## 2014-02-12 MED ORDER — CLOTRIMAZOLE-BETAMETHASONE 1-0.05 % EX CREA: TOPICAL_CREAM | Freq: Two times a day (BID) | CUTANEOUS | Status: DC

## 2014-02-12 MED ORDER — DIAZEPAM 5 MG PO TABS: 5.0000 mg | ORAL_TABLET | Freq: Four times a day (QID) | ORAL | Status: DC | PRN

## 2014-02-12 MED ORDER — AMITRIPTYLINE HCL 150 MG PO TABS: 150.0000 mg | ORAL_TABLET | Freq: Every day | ORAL | Status: DC

## 2014-02-12 NOTE — Progress Notes (Addendum)
This chart was scribed for Leandrew Koyanagi, MD by Einar Pheasant, ED Scribe. This patient was seen in room 25 and the patient's care was started at 10:45 AM.  Subjective:    Patient ID: [name], female    DOB: [birth year], 58 y.o.   MRN: [id number]  Chief Complaint  Patient presents with  . Follow-up    6 month  . Medication Refill    HPI HPI Comments: [name] is a 58 y.o. female with a history of chronic pain presents to the [facility] for her 6 month follow up. Pt is also requesting a refill on her medications.   Today, pt states she feels "horrible". She states that she is really depressed and she doesn't know what to do about it. Advised pt that there is a local psychiatric office with great psychiatrist. Pt is reluctant because she is worried that they will just prescribe her drugs and not give her an alternative treatment plan. Pt states that she knows someone locally who she's talked with in the past and his treatment plan really worked, so she is thinking about returning to him.   Pt is also complaining of left wrist pain. She is currently taking Amitryptyline 100 MG, however,she states that the current dosage is not working for her. She would like to increase the dosage. Pt is also taking Lipitor 40 MG and Valium 5 MG as needed. She states that she currently started reading this book that goes over the benefits of using Herbs in her diet. Pt is thinking to apply her findings to her diet in order to deal with her chronic pain so she will not have to take so much medication.   [name] is complaining of unwanted weight gain (weight it December was 178 lbs, today her recorded weight was 176 lbs). She is concerned that the weight gain may be due to her thyroid. Pt states that she is unable to wear some of her clothes anymore.  Pt states that she has intermittent periods of dizziness. She states that she can be sitting on the bed and she will  start feeling dizzy, which causes her to close her eyes. She denies any palpitations or leg swelling. Sudden onset, no nausea and vomiting, no loss of posture, no associated headache palpitations, no shortness of breath, no visual changes. Occurs 3-4 times per week.   Patient Active Problem List   Diagnosis Date Noted  . Allergic rhinitis due to pollen 10/26/2011  . Chronic neck pain 10/26/2011  . [place] arthritis 10/26/2011  . CTS (carpal tunnel syndrome) 10/26/2011  . PTSD (post-traumatic stress disorder) 10/26/2011  . Hyperlipemia 10/26/2011  . Hearing loss in right ear 10/26/2011  . [place] DISEASE 10/26/2006  . DEPRESSION, MAJOR, RECURRENT 10/26/2006  . OTITIS EXTERNA, UNSPECIFIED 10/26/2006   Past Medical History  Diagnosis Date  . Depression   . Thyroid disease   . Osteoporosis   . Anxiety    Past Surgical History  Procedure Laterality Date  . Appendectomy    . Hernia repair      inguinal   . Tonsillectomy and adenoidectomy    . Cholecystectomy    . Neck surgery     Allergies  Allergen Reactions  . Codeine Shortness Of Breath, Nausea And Vomiting and Rash  . Penicillins Anaphylaxis  . Morphine And Related   . Prednisone Other (See Comments)    Dizzy  . Sudafed [Pseudoephedrine Hcl] Other (See Comments)    "  Hyper"   . Sulfa Antibiotics Rash   Prior to Admission medications   Medication Sig Start Date End Date Taking? Authorizing Taiga Lupinacci  amitriptyline (ELAVIL) 100 MG tablet Take 1 tablet (100 mg total) by mouth at bedtime. 08/14/13  Yes Leandrew Koyanagi, MD  aspirin 81 MG tablet Take 81 mg by mouth daily.   Yes Historical Lurena Naeve, MD  atorvastatin (LIPITOR) 40 MG tablet Take 1 tablet (40 mg total) by mouth daily. 08/14/13  Yes Leandrew Koyanagi, MD  clotrimazole-betamethasone (LOTRISONE) cream Apply topically 2 (two) times daily. NEED REFILLS 10/31/12  Yes Leandrew Koyanagi, MD  cyclobenzaprine (FLEXERIL) 10 MG tablet Take 1 tablet (10 mg total) by mouth 3  (three) times daily as needed for muscle spasms. 09/25/13  Yes Leandrew Koyanagi, MD  diazepam (VALIUM) 5 MG tablet Take 1 tablet (5 mg total) by mouth every 6 (six) hours as needed. 08/14/13  Yes Leandrew Koyanagi, MD  levothyroxine (SYNTHROID, LEVOTHROID) 50 MCG tablet TAKE ONE TABLET EVERY MORNING 08/14/13  Yes Leandrew Koyanagi, MD  meloxicam (MOBIC) 15 MG tablet Take 1 tablet (15 mg total) by mouth daily. For shoulder and hip 02/06/13  Yes Leandrew Koyanagi, MD  meperidine (DEMEROL) 100 MG tablet Take 100 mg by mouth every 6 (six) hours as needed for pain. 12/06/12 08/14/13 02/12/14 Yes Leandrew Koyanagi, MD  NASONEX 50 MCG/ACT nasal spray TWO SPRAYS DAILY INTO NOSE 02/18/13  Yes Leandrew Koyanagi, MD   History   Social History  . Marital Status: Married    Spouse Name: N/A    Number of Children: N/A  . Years of Education: N/A   Occupational History  . Not on file.   Social History Main Topics  . Smoking status: Former Smoker    Types: Cigarettes    Quit date: 07/29/2004  . Smokeless tobacco: Not on file  . Alcohol Use: Not on file  . Drug Use: Not on file  . Sexual Activity: Yes    Partners: Male    Birth Control/ Protection: None   Other Topics Concern  . Not on file   Social History Narrative   Married. Patient walks for exercise.   Review of Systems  Constitutional: Positive for unexpected weight change. Negative for fever, chills and diaphoresis.  HENT: Negative for sore throat.   Eyes: Negative for visual disturbance.  Respiratory: Negative for cough and shortness of breath.   Cardiovascular: Negative for palpitations and leg swelling.  Gastrointestinal: Negative for vomiting and abdominal pain.  Musculoskeletal: Negative for gait problem.  Neurological: Positive for dizziness and light-headedness. Negative for syncope and headaches.  Hematological: Does not bruise/bleed easily.       Objective:   Physical Exam  Nursing note and vitals  reviewed. Constitutional: She is oriented to person, place, and time. She appears well-developed and well-nourished. No distress.  HENT:  Head: Normocephalic and atraumatic.  Eyes: Conjunctivae and EOM are normal. Pupils are equal, round, and reactive to light. Right eye exhibits no discharge. Left eye exhibits no discharge.  Neck: Neck supple. No thyromegaly present.  Cardiovascular: Normal rate, regular rhythm and normal heart sounds.  Exam reveals no gallop and no friction rub.   No murmur heard. Pulmonary/Chest: Effort normal and breath sounds normal. No respiratory distress.  Abdominal: Soft. She exhibits no distension and no mass. There is tenderness. There is guarding.  Tenderness to palpation in RUQ without a defined mass that seems to be in the muscle wall and along  the liver edge. No defect in the abdominal rectus.   Musculoskeletal: She exhibits no edema and no tenderness.  Lymphadenopathy:    She has no cervical adenopathy.  Neurological: She is alert and oriented to person, place, and time. No cranial nerve deficit. Coordination normal.  Romberg negative gait normal  Skin: Skin is warm and dry.  Psychiatric: Judgment and thought content normal.  Despite her multiple problems with her family and past psychological traumas, she has a stable mood and her affect is very appropriate under the circumstances. She has not wished to pursue psychiatry and has a person serving as a Social worker who is more intigrative in approach   Filed Vitals:   02/12/14 1034  BP: 123/84  Pulse: 85  Temp: 98.1 F (36.7 C)  Resp: 16  Height: 5' 8.5" (1.74 m)  Weight: 176 lb (79.833 kg)  SpO2: 95%   Wt Readings from Last 3 Encounters:  02/12/14 176 lb (79.833 kg)  08/14/13 178 lb (80.74 kg)  02/06/13 179 lb 12.8 oz (81.557 kg)           Assessment & Plan:  Chronic pain - Plan: Continue Demerol which is the only medication that gives her no side effects  She was followed at chronic pain  management with Dr. Hardin Negus at 1 point has been more stable here over the last several  Years  Anxiety - Plan: diazepam (VALIUM) 5 MG tablet continued when necessary/she has a counseling resource  Hypothyroid - Plan: TSH, T4, free  Other and unspecified hyperlipidemia - Plan: Lipid panel  Benign paroxysmal positional vertigo--unsure etiology with normal exam/follow of her nail  RUQ pain -prolonged and recurrent  CBC with Differential, COMPLETE METABOLIC PANEL WITH GFR  Refer toGI-Dr Kaplan///she needs her first colonoscopy screening as well  11:02 AM-Advised pt that her last tetanus vaccine was in 2005 and that her last mammogram was in 2013. Pt needs to update these two services. She will get Tdap at her six-month followup She will arrange a gynecological followup with Dr.Buie's office(now retired) 11:08 AM- pt states that she has never had a colonoscopy done. Will give pt a referral.  11:16 AM--will call in cholesterol and thyroid medication after labs have been completed.     I personally performed the services described in this documentation, which was scribed in my presence. The recorded information has been reviewed and is accurate. This required a 40 minute office visit.  Addendum Cholesterol normal TSH high and free T4 barely normal Will increase Synthroid to 75 mcg Follow up 6 months

## 2014-02-13 ENCOUNTER — Other Ambulatory Visit: Payer: Self-pay | Admitting: *Deleted

## 2014-02-13 ENCOUNTER — Encounter: Payer: Self-pay | Admitting: Internal Medicine

## 2014-02-13 MED ORDER — LEVOTHYROXINE SODIUM 75 MCG PO TABS: 75.0000 ug | ORAL_TABLET | Freq: Every day | ORAL | Status: DC

## 2014-02-13 MED ORDER — ATORVASTATIN CALCIUM 40 MG PO TABS: 40.0000 mg | ORAL_TABLET | Freq: Every day | ORAL | Status: DC

## 2014-02-13 NOTE — Addendum Note (Signed)
Addended by: Leandrew Koyanagi on: 02/13/2014 02:13 PM   Modules accepted: Orders

## 2014-02-19 ENCOUNTER — Encounter: Payer: Self-pay | Admitting: Internal Medicine

## 2014-03-02 ENCOUNTER — Other Ambulatory Visit: Payer: Self-pay | Admitting: Internal Medicine

## 2014-04-07 ENCOUNTER — Other Ambulatory Visit: Payer: Self-pay | Admitting: Internal Medicine

## 2014-04-19 ENCOUNTER — Other Ambulatory Visit: Payer: Self-pay | Admitting: Internal Medicine

## 2014-04-25 ENCOUNTER — Ambulatory Visit (INDEPENDENT_AMBULATORY_CARE_PROVIDER_SITE_OTHER): Payer: Medicare Other | Admitting: Internal Medicine

## 2014-04-25 ENCOUNTER — Encounter: Payer: Self-pay | Admitting: Internal Medicine

## 2014-04-25 VITALS — BP 126/70 | HR 88 | Ht 68.0 in | Wt 177.0 lb

## 2014-04-25 DIAGNOSIS — Z1211 Encounter for screening for malignant neoplasm of colon: Secondary | ICD-10-CM

## 2014-04-25 DIAGNOSIS — R1011 Right upper quadrant pain: Secondary | ICD-10-CM | POA: Diagnosis not present

## 2014-04-25 MED ORDER — MOVIPREP 100 G PO SOLR: 1.0000 | Freq: Once | ORAL | Status: DC

## 2014-04-25 NOTE — Progress Notes (Signed)
HISTORY OF PRESENT ILLNESS:  [name] is a 58 y.o. female with past medical history as listed below. She is disabled due to chronic spine disease requiring cervical discectomy. She is also status post remote cholecystectomy. She presents today regarding chronic right upper quadrant pain and the need for screening colonoscopy. Patient reports a greater than 10 year history of chronic right upper quadrant pain exacerbated by certain movements. She notices this most days but can be more severe about 3 times per week. This has been attributable to adhesions. She has had prior imaging studies without abnormality found (CT scan 2006). Patient has had slight weight gain. Her GI review of systems is otherwise negative. No family history of colon cancer.  REVIEW OF SYSTEMS:  All non-GI ROS negative except for sinus and allergy trouble, anxiety, arthritis, back pain, visual change, depression, fatigue, headaches, muscle cramps, sore throat  Past Medical History  Diagnosis Date  . Depression   . Graves disease     thyroid disease  . Osteoporosis   . Anxiety   . Hyperlipidemia   . Vertigo   . Allergic rhinitis   . Inguinal hernia   . Anemia   . Osteoarthritis   . Gallstones   . Pneumonia   . Heart murmur     as a child  . Carpal tunnel syndrome     Past Surgical History  Procedure Laterality Date  . Appendectomy    . Inguinal hernia repair Bilateral   . Tonsillectomy and adenoidectomy    . Cholecystectomy    . Cervical disc surgery      with cages and stem cell    Social History [name]  reports that she quit smoking about 9 years ago. Her smoking use included Cigarettes. She smoked 0.00 packs per day. She has never used smokeless tobacco. She reports that she uses illicit drugs (Marijuana). She reports that she does not drink alcohol.  family history includes Atrial fibrillation in her mother; COPD in her mother; Heart disease in her father, paternal grandfather, and  paternal grandmother; Lung cancer in her maternal grandmother.  Allergies  Allergen Reactions  . Codeine Shortness Of Breath, Nausea And Vomiting and Rash  . Penicillins Anaphylaxis  . Morphine And Related   . Prednisone Other (See Comments)    Dizzy  . Sudafed [Pseudoephedrine Hcl] Other (See Comments)    "Hyper"   . Sulfa Antibiotics Rash       PHYSICAL EXAMINATION: Vital signs: BP 126/70  Pulse 88  Ht 5\' 8"  (1.727 m)  Wt 177 lb (80.287 kg)  BMI 26.92 kg/m2  Constitutional: generally well-appearing, no acute distress Psychiatric: alert and oriented x3, cooperative Eyes: extraocular movements intact, anicteric, conjunctiva pink Mouth: oral pharynx moist, no lesions Neck: supple no lymphadenopathy Cardiovascular: heart regular rate and rhythm, no murmur Lungs: clear to auscultation bilaterally Abdomen: soft, nontender, nondistended, no obvious ascites, no peritoneal signs, normal bowel sounds, no organomegaly. Well-healed right upper quadrant scar Rectal: Deferred until colonoscopy Extremities: no lower extremity edema bilaterally. Left wrist brace Skin: no lesions on visible extremities Neuro: No focal deficits. No asterixis.    ASSESSMENT:  #1. Chronic right upper quadrant pain. Sounds musculoskeletal. May be adhesions. #2. Screening colonoscopy. Appropriate candidate without contraindication   PLAN:  #1. Abdominal ultrasound to further evaluate pain and rule out any interval problems since 2006 #2. Screening colonoscopy. Movi prep prescribed. Patient instructed on its use.The nature of the procedure, as well as the risks, benefits, and alternatives were carefully  and thoroughly reviewed with the patient. Ample time for discussion and questions allowed. The patient understood, was satisfied, and agreed to proceed.

## 2014-04-25 NOTE — Patient Instructions (Signed)
You have been scheduled for an abdominal ultrasound at  [facility] (1st floor of hospital) on 04/30/2014 at 8:30am. Please arrive 15 minutes prior to your appointment for registration. Make certain not to have anything to eat or drink 6 hours prior to your appointment. Should you need to reschedule your appointment, please contact radiology at [phone]. This test typically takes about 30 minutes to perform.   You have been scheduled for a colonoscopy. Please follow written instructions given to you at your visit today.  Please pick up your prep kit at the pharmacy within the next 1-3 days. If you use inhalers (even only as needed), please bring them with you on the day of your procedure. Your physician has requested that you go to www.startemmi.com and enter the access code given to you at your visit today. This web site gives a general overview about your procedure. However, you should still follow specific instructions given to you by our office regarding your preparation for the procedure.

## 2014-04-30 ENCOUNTER — Ambulatory Visit (HOSPITAL_COMMUNITY)
Admission: RE | Admit: 2014-04-30 | Discharge: 2014-04-30 | Disposition: A | Discharge status: Home / Self Care | Payer: Medicare Other | Source: Ambulatory Visit | Attending: Internal Medicine | Admitting: Internal Medicine

## 2014-04-30 DIAGNOSIS — Z9889 Other specified postprocedural states: Secondary | ICD-10-CM | POA: Diagnosis not present

## 2014-04-30 DIAGNOSIS — R1011 Right upper quadrant pain: Secondary | ICD-10-CM | POA: Diagnosis not present

## 2014-04-30 DIAGNOSIS — Z1211 Encounter for screening for malignant neoplasm of colon: Secondary | ICD-10-CM | POA: Insufficient documentation

## 2014-04-30 DIAGNOSIS — Z9089 Acquired absence of other organs: Secondary | ICD-10-CM | POA: Insufficient documentation

## 2014-04-30 DIAGNOSIS — K7689 Other specified diseases of liver: Secondary | ICD-10-CM | POA: Diagnosis not present

## 2014-05-08 ENCOUNTER — Telehealth: Payer: Self-pay

## 2014-05-08 ENCOUNTER — Encounter: Payer: Medicare Other | Admitting: Internal Medicine

## 2014-05-08 ENCOUNTER — Telehealth: Payer: Self-pay | Admitting: Internal Medicine

## 2014-05-08 NOTE — Telephone Encounter (Signed)
Patient states she needs a rx for mepridine?? States her pharmacy was only able to fill 40 of the 120 she is supposed to receive. She states they told her to ask for a rx for 80 so they can fill it again. Patient also wants to know if when Dr. Laney Pastor writes this he can also go ahead and write the rx for October and November she won't see him until December.  CB [phone]

## 2014-05-08 NOTE — Telephone Encounter (Signed)
It does not look like she contacted the on call Dr. for advice. Anyway, She should reschedule. We can consider prep alternatives. She needs to let my nurse Vaughan Basta know what her plan is, either way, for documentation. Thanks. No charge

## 2014-05-08 NOTE — Telephone Encounter (Signed)
Attempted to call pt back but receive message stating unable to leave message because voicemail has not been set up yet.

## 2014-05-09 NOTE — Telephone Encounter (Signed)
Not in til Monday--?needs before then

## 2014-05-09 NOTE — Telephone Encounter (Signed)
Spoke to pt- she is okay to wait until Monday. She is unable to pick up until Thursday anyhow.

## 2014-05-12 NOTE — Telephone Encounter (Signed)
Spoke with pt and she has already rescheduled her previsit and colon.

## 2014-05-15 DIAGNOSIS — Z1231 Encounter for screening mammogram for malignant neoplasm of breast: Secondary | ICD-10-CM | POA: Diagnosis not present

## 2014-05-15 DIAGNOSIS — Z1212 Encounter for screening for malignant neoplasm of rectum: Secondary | ICD-10-CM | POA: Diagnosis not present

## 2014-05-15 DIAGNOSIS — Z124 Encounter for screening for malignant neoplasm of cervix: Secondary | ICD-10-CM | POA: Diagnosis not present

## 2014-05-15 LAB — HM MAMMOGRAPHY

## 2014-05-15 NOTE — Telephone Encounter (Signed)
Pt called inquiring about prescription refill, please advise of status

## 2014-05-16 NOTE — Telephone Encounter (Signed)
Dr. Laney Pastor please advise. It does not look like the refill was sent.

## 2014-05-16 NOTE — Telephone Encounter (Signed)
Patient calling to check the status of her medication "Mepridine". Per patient her prescription was written for Qty 120 but when she turned it in last week the pharmacy only had a qty of 40. Patient is requesting another prescription to be written for a Qty of 80. patients call back number is [phone]

## 2014-05-16 NOTE — Telephone Encounter (Signed)
Ok to give her rest of rx this weekend

## 2014-05-17 MED ORDER — MEPERIDINE HCL 100 MG PO TABS: 100.0000 mg | ORAL_TABLET | Freq: Four times a day (QID) | ORAL | Status: DC | PRN

## 2014-05-17 NOTE — Telephone Encounter (Signed)
Rx printed per Dr. Ninfa Meeker note.  Meds ordered this encounter  Medications  . meperidine (DEMEROL) 100 MG tablet    Sig: Take 1 tablet (100 mg total) by mouth every 6 (six) hours as needed for pain.    Dispense:  80 tablet    Refill:  0    Order Specific Question:  Supervising Provider    Answer:  DOOLITTLE, ROBERT P [[id number]]

## 2014-05-17 NOTE — Addendum Note (Signed)
Addended by: Fara Chute on: 05/17/2014 08:50 AM   Modules accepted: Orders

## 2014-05-17 NOTE — Telephone Encounter (Signed)
Faxed

## 2014-05-17 NOTE — Telephone Encounter (Signed)
Reprinted and left up front for pt to p/u. Unable to fax this rx.

## 2014-06-02 ENCOUNTER — Telehealth: Payer: Self-pay

## 2014-06-02 MED ORDER — MEPERIDINE HCL 100 MG PO TABS: 100.0000 mg | ORAL_TABLET | Freq: Four times a day (QID) | ORAL | Status: DC | PRN

## 2014-06-02 NOTE — Telephone Encounter (Signed)
Pt is requesting a refill of demerol for October and November.

## 2014-06-02 NOTE — Telephone Encounter (Signed)
Meds ordered this encounter  Medications  . meperidine (DEMEROL) 100 MG tablet    Sig: Take 1 tablet (100 mg total) by mouth every 6 (six) hours as needed for pain.    Dispense:  120 tablet    Refill:  0  . meperidine (DEMEROL) 100 MG tablet    Sig: Take 1 tablet (100 mg total) by mouth every 6 (six) hours as needed for pain. For 30d after signing    Dispense:  120 tablet    Refill:  0

## 2014-06-02 NOTE — Telephone Encounter (Signed)
Pt advised in pick up drawer.

## 2014-06-23 ENCOUNTER — Telehealth: Payer: Self-pay | Admitting: *Deleted

## 2014-06-23 NOTE — Telephone Encounter (Signed)
Patient is for pre-visit on 06-25-14. She had cancelled last colonoscopy because she was unable to tolerate Moviprep, per phone note 05-08-14. What prep alternative would you like [place] to give patient? Thank you,Robbin.

## 2014-06-23 NOTE — Telephone Encounter (Signed)
Su prep. Also give her Reglan 10 mg before each prep session to help settle her stomach. Thanks

## 2014-06-23 NOTE — Telephone Encounter (Signed)
Noted. Information placed on patient's instructions and placed on patient's chart for pre-visit nurse.

## 2014-06-25 ENCOUNTER — Ambulatory Visit (AMBULATORY_SURGERY_CENTER): Payer: Self-pay | Admitting: *Deleted

## 2014-06-25 VITALS — Ht 69.0 in | Wt 180.8 lb

## 2014-06-25 DIAGNOSIS — Z1211 Encounter for screening for malignant neoplasm of colon: Secondary | ICD-10-CM

## 2014-06-25 MED ORDER — NA SULFATE-K SULFATE-MG SULF 17.5-3.13-1.6 GM/177ML PO SOLN: 1.0000 | Freq: Once | ORAL | Status: DC

## 2014-06-25 MED ORDER — METOCLOPRAMIDE HCL 10 MG PO TABS: 10.0000 mg | ORAL_TABLET | Freq: Two times a day (BID) | ORAL | Status: DC

## 2014-06-25 NOTE — Progress Notes (Signed)
No egg or soy allergy. ewm No issues with past sedation. ewm No diet pills. ewm No home 02 use. ewm emmi video to pt's e mail. ewm Per 10-26 TE from perry, use reglan before each prep and try su prep this time as movi made pt very sick in September. ewm

## 2014-07-03 ENCOUNTER — Other Ambulatory Visit: Payer: Self-pay | Admitting: Internal Medicine

## 2014-07-04 ENCOUNTER — Telehealth: Payer: Self-pay | Admitting: Internal Medicine

## 2014-07-04 ENCOUNTER — Encounter: Payer: Self-pay | Admitting: *Deleted

## 2014-07-04 NOTE — Telephone Encounter (Signed)
Dr Henrene Pastor,  Pt states the movi prep made her sick  With her last colon so this time you  gave suprep and its not covered by her insurance and is > $100.00 . Pt states she cannot afford this and wanted to know if she could just get the movi prep. She did get the reglan as ordered to use before the suprep as directed. Do you want to do the movi prep with the reglan or would you rather her use miralax in gatorade since the movi made her vomit and she was not able to complete movi last time.? pts colon is 11-11 Wednesday   Please advise.  Thanks  Lelan Pons PV

## 2014-07-04 NOTE — Telephone Encounter (Signed)
Pt notified of change in prep. Pt will come  Monday and pick up new instructions.   Lelan Pons PV

## 2014-07-04 NOTE — Telephone Encounter (Signed)
Okay to use MiraLAX split prep. Reglan with that. Thanks

## 2014-07-09 ENCOUNTER — Encounter: Payer: Self-pay | Admitting: Internal Medicine

## 2014-07-09 ENCOUNTER — Ambulatory Visit (AMBULATORY_SURGERY_CENTER): Payer: Medicare Other | Admitting: Internal Medicine

## 2014-07-09 VITALS — BP 129/81 | HR 82 | Temp 97.4°F | Resp 25 | Ht 69.0 in | Wt 180.0 lb

## 2014-07-09 DIAGNOSIS — D125 Benign neoplasm of sigmoid colon: Secondary | ICD-10-CM

## 2014-07-09 DIAGNOSIS — F341 Dysthymic disorder: Secondary | ICD-10-CM | POA: Diagnosis not present

## 2014-07-09 DIAGNOSIS — Z1211 Encounter for screening for malignant neoplasm of colon: Secondary | ICD-10-CM | POA: Diagnosis not present

## 2014-07-09 DIAGNOSIS — R109 Unspecified abdominal pain: Secondary | ICD-10-CM | POA: Diagnosis not present

## 2014-07-09 DIAGNOSIS — F4310 Post-traumatic stress disorder, unspecified: Secondary | ICD-10-CM | POA: Diagnosis not present

## 2014-07-09 DIAGNOSIS — G8929 Other chronic pain: Secondary | ICD-10-CM | POA: Diagnosis not present

## 2014-07-09 MED ORDER — SODIUM CHLORIDE 0.9 % IV SOLN: 500.0000 mL | INTRAVENOUS | Status: DC

## 2014-07-09 NOTE — Op Note (Signed)
[place]  [address]. [place], [address]   COLONOSCOPY PROCEDURE REPORT  PATIENT: Amber, Howell  MR#: [id number] BIRTHDATE: September 23, 1955 , 58  yrs. old GENDER: female ENDOSCOPIST: Eustace Quail, MD REFERRED WU:[id number] Laney Pastor, M.D. PROCEDURE DATE:  07/09/2014 PROCEDURE:   Colonoscopy with snare polypectomy x 1 First Screening Colonoscopy - Avg.  risk and is 50 yrs.  old or older Yes.  Prior Negative Screening - Now for repeat screening. N/A  History of Adenoma - Now for follow-up colonoscopy & has been > or = to 3 yrs.  N/A  Polyps Removed Today? Yes. ASA CLASS:   Class II INDICATIONS:average risk for colorectal cancer. MEDICATIONS: Monitored anesthesia care and Propofol 400 mg IV  DESCRIPTION OF PROCEDURE:   After the risks benefits and alternatives of the procedure were thoroughly explained, informed consent was obtained.  The digital rectal exam revealed no abnormalities of the rectum.   The [facility] [id number]  endoscope was introduced through the anus and advanced to the cecum, which was identified by both the appendix and ileocecal valve. No adverse events experienced.   The quality of the prep was good, using MiraLax  The instrument was then slowly withdrawn as the colon was fully examined.   COLON FINDINGS: A single polyp measuring 5 mm in size was found in the sigmoid colon.  A polypectomy was performed with a cold snare. The resection was complete, the polyp tissue was completely retrieved and sent to histology.   The examination was otherwise normal.  Retroflexed views revealed internal hemorrhoids. The time to cecum=4 minutes 35 seconds.  Withdrawal time=11 minutes 35 seconds.  The scope was withdrawn and the procedure completed.  COMPLICATIONS: There were no immediate complications.  ENDOSCOPIC IMPRESSION: 1.   Single polyp measuring 5 mm in size was found in the sigmoid colon; polypectomy was performed with a cold snare 2.   The  examination was otherwise normal  RECOMMENDATIONS: 1. Repeat colonoscopy in 5 years if polyp adenomatous; otherwise 10 years  eSigned:  Eustace Quail, MD 07/09/2014 11:49 AM   cc: Tami Lin, MD and The Patient

## 2014-07-09 NOTE — Progress Notes (Signed)
Called to room to assist during endoscopic procedure.  Patient ID and intended procedure confirmed with present staff. Received instructions for my participation in the procedure from the performing physician.  

## 2014-07-09 NOTE — Patient Instructions (Signed)
YOU HAD AN ENDOSCOPIC PROCEDURE TODAY AT THE [facility]: Refer to the procedure report that was given to you for any specific questions about what was found during the examination.  If the procedure report does not answer your questions, please call your gastroenterologist to clarify.  If you requested that your care partner not be given the details of your procedure findings, then the procedure report has been included in a sealed envelope for you to review at your convenience later.  YOU SHOULD EXPECT: Some feelings of bloating in the abdomen. Passage of more gas than usual.  Walking can help get rid of the air that was put into your GI tract during the procedure and reduce the bloating. If you had a lower endoscopy (such as a colonoscopy or flexible sigmoidoscopy) you may notice spotting of blood in your stool or on the toilet paper. If you underwent a bowel prep for your procedure, then you may not have a normal bowel movement for a few days.  DIET: Your first meal following the procedure should be a light meal and then it is ok to progress to your normal diet.  A half-sandwich or bowl of soup is an example of a good first meal.  Heavy or fried foods are harder to digest and may make you feel nauseous or bloated.  Likewise meals heavy in dairy and vegetables can cause extra gas to form and this can also increase the bloating.  Drink plenty of fluids but you should avoid alcoholic beverages for 24 hours.  ACTIVITY: Your care partner should take you home directly after the procedure.  You should plan to take it easy, moving slowly for the rest of the day.  You can resume normal activity the day after the procedure however you should NOT DRIVE or use heavy machinery for 24 hours (because of the sedation medicines used during the test).    SYMPTOMS TO REPORT IMMEDIATELY: A gastroenterologist can be reached at any hour.  During normal business hours, 8:30 AM to 5:00 PM Monday through Friday,  call [phone].  After hours and on weekends, please call the GI answering service at [phone] who will take a message and have the physician on call contact you.   Following lower endoscopy (colonoscopy or flexible sigmoidoscopy):  Excessive amounts of blood in the stool  Significant tenderness or worsening of abdominal pains  Swelling of the abdomen that is new, acute  Fever of 100F or higher FOLLOW UP: If any biopsies were taken you will be contacted by phone or by letter within the next 1-3 weeks.  Call your gastroenterologist if you have not heard about the biopsies in 3 weeks.  Our staff will call the home number listed on your records the next business day following your procedure to check on you and address any questions or concerns that you may have at that time regarding the information given to you following your procedure. This is a courtesy call and so if there is no answer at the home number and we have not heard from you through the emergency physician on call, we will assume that you have returned to your regular daily activities without incident.  SIGNATURES/CONFIDENTIALITY: You and/or your care partner have signed paperwork which will be entered into your electronic medical record.  These signatures attest to the fact that that the information above on your After Visit Summary has been reviewed and is understood.  Full responsibility of the confidentiality of this discharge   information lies with you and/or your care-partner.  Polyp information given. 

## 2014-07-09 NOTE — Progress Notes (Signed)
Awake alert and oriented X3, VSS, pleased with MAC. Report to RN

## 2014-07-10 ENCOUNTER — Telehealth: Payer: Self-pay | Admitting: *Deleted

## 2014-07-10 NOTE — Telephone Encounter (Signed)
  Follow up Call-  Call back number 07/09/2014  Post procedure Call Back phone  # [phone]  Permission to leave phone message Yes     Patient questions:  Do you have a fever, pain , or abdominal swelling? No. Pain Score  0 *  Have you tolerated food without any problems? Yes.    Have you been able to return to your normal activities? Yes.    Do you have any questions about your discharge instructions: Diet   No. Medications  No. Follow up visit  No.  Do you have questions or concerns about your Care? No.  Actions: * If pain score is 4 or above: No action needed, pain <4.

## 2014-07-16 ENCOUNTER — Encounter: Payer: Self-pay | Admitting: Internal Medicine

## 2014-07-25 ENCOUNTER — Telehealth: Payer: Self-pay | Admitting: Internal Medicine

## 2014-07-25 NOTE — Telephone Encounter (Signed)
Refill on meperidine (DEMEROL) 100 MG   [phone]

## 2014-07-26 MED ORDER — MEPERIDINE HCL 100 MG PO TABS: 100.0000 mg | ORAL_TABLET | Freq: Four times a day (QID) | ORAL | Status: DC | PRN

## 2014-07-26 NOTE — Telephone Encounter (Signed)
Will sign monday 

## 2014-07-28 NOTE — Telephone Encounter (Signed)
Advised pt Rx would be ready after 5 pm today.

## 2014-08-08 ENCOUNTER — Other Ambulatory Visit: Payer: Self-pay | Admitting: Internal Medicine

## 2014-08-13 ENCOUNTER — Encounter: Payer: Self-pay | Admitting: Internal Medicine

## 2014-08-13 ENCOUNTER — Ambulatory Visit (INDEPENDENT_AMBULATORY_CARE_PROVIDER_SITE_OTHER): Payer: Medicare Other | Admitting: Internal Medicine

## 2014-08-13 VITALS — BP 110/70 | HR 80 | Temp 98.4°F | Resp 18 | Ht 69.0 in | Wt 180.0 lb

## 2014-08-13 DIAGNOSIS — E785 Hyperlipidemia, unspecified: Secondary | ICD-10-CM | POA: Diagnosis not present

## 2014-08-13 DIAGNOSIS — K760 Fatty (change of) liver, not elsewhere classified: Secondary | ICD-10-CM

## 2014-08-13 DIAGNOSIS — F4310 Post-traumatic stress disorder, unspecified: Secondary | ICD-10-CM

## 2014-08-13 DIAGNOSIS — G8929 Other chronic pain: Secondary | ICD-10-CM

## 2014-08-13 DIAGNOSIS — M542 Cervicalgia: Secondary | ICD-10-CM | POA: Diagnosis not present

## 2014-08-13 DIAGNOSIS — F419 Anxiety disorder, unspecified: Secondary | ICD-10-CM | POA: Diagnosis not present

## 2014-08-13 DIAGNOSIS — E039 Hypothyroidism, unspecified: Secondary | ICD-10-CM

## 2014-08-13 MED ORDER — ATORVASTATIN CALCIUM 40 MG PO TABS: 40.0000 mg | ORAL_TABLET | Freq: Every day | ORAL | Status: DC

## 2014-08-13 MED ORDER — MEPERIDINE HCL 100 MG PO TABS: 100.0000 mg | ORAL_TABLET | Freq: Four times a day (QID) | ORAL | Status: DC | PRN

## 2014-08-13 MED ORDER — DIAZEPAM 5 MG PO TABS: 5.0000 mg | ORAL_TABLET | Freq: Four times a day (QID) | ORAL | Status: DC | PRN

## 2014-08-13 MED ORDER — CYCLOBENZAPRINE HCL 10 MG PO TABS
10.0000 mg | ORAL_TABLET | Freq: Three times a day (TID) | ORAL | Status: DC | PRN
Start: 2014-08-13 — End: 2015-02-18

## 2014-08-13 NOTE — Progress Notes (Signed)
MRN: [id number] DOB: 01/13/1956  Subjective:   Chief Complaint  Patient presents with  . Pain    level 6 today does not know what she needs refills on    [name] is a 58 y.o. female presenting for 6 month follow up.  Fatty Liver:  Ultrasound imaging forchronic abdominal pain.  She states that she is eating a lot of healthy choices while taking care of mother, but may be eating too much.  Consuming fruits and vegetables.  She is compliant on her medication.  Very little aerobic exercise, unless working with mother.    Hypothyroidism, unspecified hypothyroidism type: Compliant with levothyroxin.  Has no concerns to mention regarding her thyroid.  She denies palpitiatons, dizziness, diarrhea, constipation, or abnormal change in skin texture.    Dysthymia: Her mood is fine.  She has not been partaking in personal interest, but states that she is very time consumed with managing the care of her mother who has dementia.  She denies SI/HI, abnormal appetite, sleep, or energy.  Sleeps well with the amitriptyline.    Anxiety:  3-4 tablets of the Diazepam per day for anxious episodes.  She is unsure of they relieve her anxiety, however she is able to pursue and complete task with the anxiety, and it never becomes out of control, according to patient.  Anxiety is triggered with interactions with mother at home.     [name] has a current medication list which includes the following prescription(s): amitriptyline, aspirin, atorvastatin, calcium carbonate, clotrimazole-betamethasone, cyclobenzaprine, diazepam, fiber, ginkgo biloba, levothyroxine, meloxicam, meperidine, metoclopramide, multivitamin, nasonex, vitamin e, meperidine, and meperidine.  She is allergic to codeine; penicillins; morphine and related; prednisone; sudafed; and sulfa antibiotics.  [name]  has a past medical history of Depression; Graves disease; Osteoporosis; Anxiety; Hyperlipidemia; Vertigo; Allergic rhinitis;  Inguinal hernia; Anemia; Osteoarthritis; Gallstones; Pneumonia; Heart murmur; Carpal tunnel syndrome; and Allergy. Also  has past surgical history that includes Appendectomy; Inguinal hernia repair (Bilateral); Tonsillectomy and adenoidectomy; Cholecystectomy; and Cervical disc surgery.  ROS As in subjective.  Objective:   Vitals: BP 110/70 mmHg  Pulse 80  Temp(Src) 98.4 F (36.9 C)  Resp 18  Ht 5\' 9"  (1.753 m)  Wt 180 lb (81.647 kg)  BMI 26.57 kg/m2  SpO2 96%  Physical Exam  Constitutional: She is oriented to person, place, and time. She appears well-developed and well-nourished. No distress.  HENT:  Head: Normocephalic and atraumatic.  Eyes: Conjunctivae and EOM are normal. Pupils are equal, round, and reactive to light. Right eye exhibits no discharge. Left eye exhibits no discharge.  Neck: Thyromegaly (Full thyroid without masses or abnormal pulsations or bruits.) present.  Cardiovascular: Normal rate, regular rhythm, normal heart sounds and intact distal pulses.  Exam reveals no gallop and no friction rub.   No murmur heard. Pulmonary/Chest: Effort normal and breath sounds normal. No respiratory distress. She has no wheezes.  Neurological: She is alert and oriented to person, place, and time.  Skin: Skin is warm and dry.  Psychiatric: She has a normal mood and affect. Her behavior is normal. Judgment and thought content normal.     Assessment and Plan :  58 year old female is here today for 6 month follow up and medication refill.  Hepatic Steatosis Low fat diet discussed, adding exercise in lifestyle, and fish oil supplement recommended to patient.  Advised to continue statin, which she is agreeable and tolerates.    Chronic pain Stable  cyclobenzaprine (FLEXERIL) 10 MG tablet, meperidine (DEMEROL) 100  MG tablet, meperidine (DEMEROL) 100 MG tablet, meperidine (DEMEROL) 100 MG tablet  Anxiety Stable, patient will increase to a 1.5 tablet-2 tablets and gauge if this  dosage relieves the anxiety with greater affinity.  She has attempted this in the past without any sedating qualities.   diazepam (VALIUM) 5 MG tablet  Hypothyroidism, unspecified hypothyroidism type T4, Free, TSH  Chronic neck pain cyclobenzaprine (FLEXERIL) 10 MG tablet, meperidine (DEMEROL) 100 MG tablet, meperidine (DEMEROL) 100 MG tablet, meperidine (DEMEROL) 100 MG tablet  PTSD (post-traumatic stress disorder)  Hyperlipemia Stable, Tolerating medication well atorvastatin (LIPITOR) 40 MG tablet  Ivar Drape, PA-C assisted in the care of this patient RD Laney Pastor MD [facility] 12/16/20151:37 PM  Meds ordered this encounter  Medications  . atorvastatin (LIPITOR) 40 MG tablet    Sig: Take 1 tablet (40 mg total) by mouth daily.    Dispense:  90 tablet    Refill:  1  . diazepam (VALIUM) 5 MG tablet    Sig: Take 1 tablet (5 mg total) by mouth every 6 (six) hours as needed.    Dispense:  120 tablet    Refill:  5  . cyclobenzaprine (FLEXERIL) 10 MG tablet    Sig: Take 1 tablet (10 mg total) by mouth 3 (three) times daily as needed. for muscle spams    Dispense:  90 tablet    Refill:  5  . meperidine (DEMEROL) 100 MG tablet    Sig: Take 1 tablet (100 mg total) by mouth every 6 (six) hours as needed for pain.    Dispense:  120 tablet    Refill:  0  . meperidine (DEMEROL) 100 MG tablet    Sig: Take 1 tablet (100 mg total) by mouth every 6 (six) hours as needed for pain. For 30d after signing    Dispense:  120 tablet    Refill:  0  . meperidine (DEMEROL) 100 MG tablet    Sig: Take 1 tablet (100 mg total) by mouth every 6 (six) hours as needed for pain. For 60d after signed    Dispense:  120 tablet    Refill:  0

## 2014-08-14 LAB — TSH: TSH: 6.163 u[IU]/mL — ABNORMAL HIGH (ref 0.350–4.500)

## 2014-08-14 LAB — T4, FREE: Free T4: 1.03 ng/dL (ref 0.80–1.80)

## 2014-09-05 ENCOUNTER — Other Ambulatory Visit: Payer: Self-pay | Admitting: Internal Medicine

## 2014-09-15 ENCOUNTER — Other Ambulatory Visit: Payer: Self-pay | Admitting: Internal Medicine

## 2014-10-14 ENCOUNTER — Other Ambulatory Visit: Payer: Self-pay | Admitting: Physician Assistant

## 2014-10-16 ENCOUNTER — Other Ambulatory Visit: Payer: Self-pay | Admitting: Physician Assistant

## 2014-10-20 ENCOUNTER — Telehealth: Payer: Self-pay

## 2014-10-20 NOTE — Telephone Encounter (Signed)
PA was approved through 10/20/15. Notified pt.

## 2014-10-20 NOTE — Telephone Encounter (Signed)
PA needed for Demerol. Completed on Covermymeds for expedited review. Pending. Info on hx of med trials on 10/18/12 phone message.

## 2014-10-28 ENCOUNTER — Other Ambulatory Visit: Payer: Self-pay | Admitting: Internal Medicine

## 2014-11-24 ENCOUNTER — Telehealth: Payer: Self-pay

## 2014-11-24 DIAGNOSIS — G8929 Other chronic pain: Secondary | ICD-10-CM

## 2014-11-24 DIAGNOSIS — M542 Cervicalgia: Principal | ICD-10-CM

## 2014-11-24 NOTE — Telephone Encounter (Signed)
PT IS REQUESTING A REFILL OF DEMEROL.

## 2014-11-26 MED ORDER — MEPERIDINE HCL 100 MG PO TABS: 100.0000 mg | ORAL_TABLET | Freq: Four times a day (QID) | ORAL | Status: DC | PRN

## 2014-11-26 NOTE — Telephone Encounter (Signed)
Meds ordered this encounter  Medications  . meperidine (DEMEROL) 100 MG tablet    Sig: Take 1 tablet (100 mg total) by mouth every 6 (six) hours as needed for pain. For 60d after signed    Dispense:  120 tablet    Refill:  0  . meperidine (DEMEROL) 100 MG tablet    Sig: Take 1 tablet (100 mg total) by mouth every 6 (six) hours as needed for pain.    Dispense:  120 tablet    Refill:  0  . meperidine (DEMEROL) 100 MG tablet    Sig: Take 1 tablet (100 mg total) by mouth every 6 (six) hours as needed for pain. For 30d after signing    Dispense:  120 tablet    Refill:  0

## 2014-11-27 NOTE — Telephone Encounter (Signed)
Rx in pick up draw. Pt notified. 

## 2014-12-11 DIAGNOSIS — H40013 Open angle with borderline findings, low risk, bilateral: Secondary | ICD-10-CM | POA: Diagnosis not present

## 2015-02-02 ENCOUNTER — Encounter: Payer: Self-pay | Admitting: *Deleted

## 2015-02-03 DIAGNOSIS — R1030 Lower abdominal pain, unspecified: Secondary | ICD-10-CM | POA: Diagnosis not present

## 2015-02-03 DIAGNOSIS — R140 Abdominal distension (gaseous): Secondary | ICD-10-CM | POA: Diagnosis not present

## 2015-02-03 DIAGNOSIS — R102 Pelvic and perineal pain: Secondary | ICD-10-CM | POA: Diagnosis not present

## 2015-02-10 DIAGNOSIS — R102 Pelvic and perineal pain: Secondary | ICD-10-CM | POA: Diagnosis not present

## 2015-02-16 ENCOUNTER — Other Ambulatory Visit: Payer: Self-pay | Admitting: Internal Medicine

## 2015-02-16 NOTE — Telephone Encounter (Signed)
Rx faxed

## 2015-02-18 ENCOUNTER — Encounter: Payer: Self-pay | Admitting: Internal Medicine

## 2015-02-18 ENCOUNTER — Ambulatory Visit (INDEPENDENT_AMBULATORY_CARE_PROVIDER_SITE_OTHER): Payer: Medicare Other | Admitting: Internal Medicine

## 2015-02-18 VITALS — BP 135/87 | HR 86 | Temp 98.5°F | Resp 16 | Ht 68.5 in | Wt 179.4 lb

## 2015-02-18 DIAGNOSIS — E0500 Thyrotoxicosis with diffuse goiter without thyrotoxic crisis or storm: Secondary | ICD-10-CM | POA: Diagnosis not present

## 2015-02-18 DIAGNOSIS — E039 Hypothyroidism, unspecified: Secondary | ICD-10-CM

## 2015-02-18 DIAGNOSIS — F4310 Post-traumatic stress disorder, unspecified: Secondary | ICD-10-CM | POA: Diagnosis not present

## 2015-02-18 DIAGNOSIS — R1010 Upper abdominal pain, unspecified: Secondary | ICD-10-CM | POA: Diagnosis not present

## 2015-02-18 DIAGNOSIS — F331 Major depressive disorder, recurrent, moderate: Secondary | ICD-10-CM | POA: Diagnosis not present

## 2015-02-18 DIAGNOSIS — R1011 Right upper quadrant pain: Secondary | ICD-10-CM

## 2015-02-18 DIAGNOSIS — G8929 Other chronic pain: Secondary | ICD-10-CM | POA: Diagnosis not present

## 2015-02-18 DIAGNOSIS — E785 Hyperlipidemia, unspecified: Secondary | ICD-10-CM | POA: Diagnosis not present

## 2015-02-18 DIAGNOSIS — M542 Cervicalgia: Secondary | ICD-10-CM

## 2015-02-18 LAB — CBC WITH DIFFERENTIAL/PLATELET
Basophils Absolute: 0.1 10*3/uL (ref 0.0–0.1)
Basophils Relative: 1 % (ref 0–1)
Eosinophils Absolute: 0.2 10*3/uL (ref 0.0–0.7)
Eosinophils Relative: 3 % (ref 0–5)
HCT: 42.7 % (ref 36.0–46.0)
Hemoglobin: 14.6 g/dL (ref 12.0–15.0)
Lymphocytes Relative: 40 % (ref 12–46)
Lymphs Abs: 2.9 10*3/uL (ref 0.7–4.0)
MCH: 30.6 pg (ref 26.0–34.0)
MCHC: 34.2 g/dL (ref 30.0–36.0)
MCV: 89.5 fL (ref 78.0–100.0)
MPV: 10.7 fL (ref 8.6–12.4)
Monocytes Absolute: 0.6 10*3/uL (ref 0.1–1.0)
Monocytes Relative: 8 % (ref 3–12)
Neutro Abs: 3.5 10*3/uL (ref 1.7–7.7)
Neutrophils Relative %: 48 % (ref 43–77)
Platelets: 281 10*3/uL (ref 150–400)
RBC: 4.77 MIL/uL (ref 3.87–5.11)
RDW: 13.5 % (ref 11.5–15.5)
WBC: 7.3 10*3/uL (ref 4.0–10.5)

## 2015-02-18 LAB — COMPREHENSIVE METABOLIC PANEL
ALT: 57 U/L — ABNORMAL HIGH (ref 0–35)
AST: 37 U/L (ref 0–37)
Albumin: 4.7 g/dL (ref 3.5–5.2)
Alkaline Phosphatase: 92 U/L (ref 39–117)
BUN: 5 mg/dL — ABNORMAL LOW (ref 6–23)
CO2: 25 mEq/L (ref 19–32)
Calcium: 9.5 mg/dL (ref 8.4–10.5)
Chloride: 102 mEq/L (ref 96–112)
Creat: 0.95 mg/dL (ref 0.50–1.10)
Glucose, Bld: 89 mg/dL (ref 70–99)
Potassium: 4 mEq/L (ref 3.5–5.3)
Sodium: 139 mEq/L (ref 135–145)
Total Bilirubin: 0.4 mg/dL (ref 0.2–1.2)
Total Protein: 7.7 g/dL (ref 6.0–8.3)

## 2015-02-18 LAB — LIPID PANEL
CHOL/HDL RATIO: 4.8 ratio
Cholesterol: 236 mg/dL — ABNORMAL HIGH (ref 0–200)
HDL: 49 mg/dL (ref >=46)
LDL Cholesterol: 152 mg/dL — ABNORMAL HIGH (ref 0–99)
TRIGLYCERIDES: 177 mg/dL — AB (ref <150)
VLDL: 35 mg/dL (ref 0–40)

## 2015-02-18 LAB — TSH: TSH: 4.018 u[IU]/mL (ref 0.350–4.500)

## 2015-02-18 MED ORDER — MEPERIDINE HCL 100 MG PO TABS: 100.0000 mg | ORAL_TABLET | Freq: Four times a day (QID) | ORAL | Status: DC | PRN

## 2015-02-18 MED ORDER — AMITRIPTYLINE HCL 150 MG PO TABS: 150.0000 mg | ORAL_TABLET | Freq: Every day | ORAL | Status: DC

## 2015-02-18 MED ORDER — ATORVASTATIN CALCIUM 40 MG PO TABS: 40.0000 mg | ORAL_TABLET | Freq: Every day | ORAL | Status: DC

## 2015-02-18 MED ORDER — CYCLOBENZAPRINE HCL 10 MG PO TABS: 10.0000 mg | ORAL_TABLET | Freq: Three times a day (TID) | ORAL | Status: DC | PRN

## 2015-02-18 MED ORDER — MOMETASONE FUROATE 50 MCG/ACT NA SUSP: NASAL | Status: DC

## 2015-02-18 NOTE — Progress Notes (Signed)
Subjective:    Patient ID: [name], female    DOB: 04-Dec-1955, 59 y.o.   MRN: [id number]  HPI follow-up Chief Complaint  Patient presents with  . Follow-up    per patient labwork  . Medication Refill  . Depression    patient answers were positive on scale   Patient Active Problem List   Diagnosis Date Noted  . Hypothyroidism 02/18/2015     She continues with fatigue and poor exercise tolerance// she is not very active// her depression interferes with motivation  . Allergic rhinitis due to pollen 10/26/2011  . Chronic neck pain//// she continues on Demerol because she has not responded to other medicines or had too many side effects with long-acting opiates  she was treated in the past by Dr. Nicholaus Bloom but had some any side effects that she declines further referral to pain management 10/26/2011  . [place] arthritis 10/26/2011  . CTS (carpal tunnel syndrome) 10/26/2011  . PTSD (post-traumatic stress disorder)--- see history and chart 10/26/2011  . Hyperlipemia 10/26/2011  . Hearing loss in right ear 10/26/2011  . Major depressive disorder, recurrent episode--- major stressors in her life continue to prevent recovery. Despite medication. She has to care for an invalid mother which is very stressful. She has children and grandchildren- they often cause her significant stress.  she has not been willing to do therapy in the past 10/26/2006  . OTITIS EXTERNA, UNSPECIFIED 10/26/2006    ---recent problems with abdominal pain-- see full gynecological evaluation/ GI evaluation= ruq pain in certain sitting positions Not food related S/p cholecyst Known fatty liver from gbus--see Dr Blanch Media note-- colonoscopy 11 2015 Pelvic pain==no probs per dr wein//PAP wnl//Mammo on sched q 2 yrs/// pelvic ultrasound negative Mild decreased appetite Weight gain she thinks Wt Readings from Last 3 Encounters:  02/18/15 179 lb 6.4 oz (81.375 kg)  08/13/14 180 lb (81.647 kg)  07/09/14 180 lb  (81.647 kg)   Weight gain was really 2013 2 2014 15-20 pouns Reflux restarted recently-- food related Diet=not bad  denies sleep disturbance  Review of Systems  Constitutional: Negative for unexpected weight change.  Respiratory: Negative for chest tightness, shortness of breath and wheezing.   Cardiovascular: Negative for chest pain, palpitations and leg swelling.  Genitourinary: Negative for frequency and difficulty urinating.  Psychiatric/Behavioral: Positive for dysphoric mood. Negative for hallucinations, confusion, sleep disturbance and decreased concentration. The patient is nervous/anxious.        Objective:   Physical Exam BP 135/87 mmHg  Pulse 86  Temp(Src) 98.5 F (36.9 C) (Oral)  Resp 16  Ht 5' 8.5" (1.74 m)  Wt 179 lb 6.4 oz (81.375 kg)  BMI 26.88 kg/m2  SpO2 95%  appears well  HEENT clear with no thyromegaly or nodules  Heart regular without murmur/ no carotid bruits  Chest clear  Abdomen is soft-- there is marked tenderness to light palpation in the right upper quadrant along her cholecystectomy scar without evidence of a direct hernia. There is some tenderness in the right upper quadrant and a larger area but to light palpation. There is no mass felt or hepatomegaly on deeper palpation. Ribs are nontender.  extremities clear with good pulses and no edema  Neurological intact  Her mood is stable her affec is somewhat depressed are thought contet is normal      Wt Readings from Last 3 Encounters:  02/18/15 179 lb 6.4 oz (81.375 kg)  08/13/14 180 lb (81.647 kg)  07/09/14 180 lb (81.647 kg)  Assessment & Plan:  Chronic neck pain - Plan: meperidine (DEMEROL) 100 MG tablet, meperidine (DEMEROL) 100 MG tablet, meperidine (DEMEROL) 100 MG tablet, cyclobenzaprine (FLEXERIL) 10 MG tablet  we discussed further intervention at this time or a change to long-acting medication. Her insurance covers none of the options that we can write for. She does not want more  physical therapy. She does not want surgery. For now we will continue the above plan  Toxic diffuse goiter-- See history/ check labs Hypothyroidism, unspecified hypothyroidism type - Plan: TSH  Hyperlipemia - Plan: Lipid panel, atorvastatin (LIPITOR) 40 MG tablet  PTSD (post-traumatic stress disorder) Major depressive disorder, recurrent episode, moderate   no change in medications  RUQ pain - Plan: CBC with Differential/Platelet, Comprehensive metabolic panel//// Gynecology suggestion we will go ahead  with CT of the abdomen--- this may be abdominal wall pain. This may be a neuropathic pain although unusual.    Declines flu/pneumo/zostavax CT abd pelvis  Meds ordered this encounter  Medications  . meperidine (DEMEROL) 100 MG tablet    Sig: Take 1 tablet (100 mg total) by mouth every 6 (six) hours as needed for pain. For 30d after signing    Dispense:  120 tablet    Refill:  0  . meperidine (DEMEROL) 100 MG tablet    Sig: Take 1 tablet (100 mg total) by mouth every 6 (six) hours as needed for pain. For 60d after signed    Dispense:  120 tablet    Refill:  0  . meperidine (DEMEROL) 100 MG tablet    Sig: Take 1 tablet (100 mg total) by mouth every 6 (six) hours as needed for pain.    Dispense:  120 tablet    Refill:  0  . cyclobenzaprine (FLEXERIL) 10 MG tablet    Sig: Take 1 tablet (10 mg total) by mouth 3 (three) times daily as needed. for muscle spams    Dispense:  90 tablet    Refill:  5  . atorvastatin (LIPITOR) 40 MG tablet    Sig: Take 1 tablet (40 mg total) by mouth daily.    Dispense:  90 tablet    Refill:  1  . amitriptyline (ELAVIL) 150 MG tablet    Sig: Take 1 tablet (150 mg total) by mouth at bedtime.    Dispense:  90 tablet    Refill:  3  . mometasone (NASONEX) 50 MCG/ACT nasal spray    Sig: INSTILL 2 SPRAYS IN EACH NOSTRIL DAILY    Dispense:  17 g    Refill:  9

## 2015-02-25 ENCOUNTER — Telehealth: Payer: Self-pay | Admitting: *Deleted

## 2015-02-25 ENCOUNTER — Ambulatory Visit
Admission: RE | Admit: 2015-02-25 | Discharge: 2015-02-25 | Disposition: A | Discharge status: Home / Self Care | Payer: Medicare Other | Source: Ambulatory Visit | Attending: Internal Medicine | Admitting: Internal Medicine

## 2015-02-25 DIAGNOSIS — K838 Other specified diseases of biliary tract: Secondary | ICD-10-CM | POA: Diagnosis not present

## 2015-02-25 DIAGNOSIS — R1011 Right upper quadrant pain: Secondary | ICD-10-CM | POA: Diagnosis not present

## 2015-02-25 DIAGNOSIS — Z9049 Acquired absence of other specified parts of digestive tract: Secondary | ICD-10-CM | POA: Diagnosis not present

## 2015-02-25 DIAGNOSIS — G8929 Other chronic pain: Secondary | ICD-10-CM

## 2015-02-25 MED ORDER — IOPAMIDOL (ISOVUE-300) INJECTION 61%
100.0000 mL | Freq: Once | INTRAVENOUS | Status: AC | PRN
  Administered 2015-02-25: 100 mL via INTRAVENOUS

## 2015-02-25 NOTE — Telephone Encounter (Signed)
Faxing mammo report request to [facility] at [facility] (Dr. Stann Mainland).  Will update health maintenance once received.

## 2015-03-02 ENCOUNTER — Telehealth: Payer: Self-pay | Admitting: Radiology

## 2015-03-02 DIAGNOSIS — R1011 Right upper quadrant pain: Principal | ICD-10-CM

## 2015-03-02 DIAGNOSIS — G8929 Other chronic pain: Secondary | ICD-10-CM

## 2015-03-02 NOTE — Telephone Encounter (Signed)
Error

## 2015-03-02 NOTE — Telephone Encounter (Addendum)
I spoke to patient/ she does still have pain with her abdomen.  I have reviewed her CT in detail with her she has reviewed on my chart and has questions. She is concerned about your comment regarding the possible stone in her duct, her GB surgery was 20 yrs ago. She states she has seen Dr Henrene Pastor in the past would like to return to see him, as you suggested. I pended order for GI, will you sign? (I don't think I can do this now). Explained lab results as well, mild elevation of ALT and her cholesterol is elevated, she has not been using her cholesterol meds, will resume using these now.

## 2015-03-04 ENCOUNTER — Other Ambulatory Visit: Payer: Self-pay | Admitting: Internal Medicine

## 2015-03-04 NOTE — Telephone Encounter (Signed)
Pt wants refill on Mobic has an appt 12/16 with you, last seen 1 year ago with 6 months of refills.  Do you want to refill or should she come in earlier for follow up?

## 2015-03-04 NOTE — Telephone Encounter (Signed)
Received fax from [place] [facility] for mammo results dated 05/16/2014.  Updated health maintenance, abstracted, and sent to be scanned into EMR.

## 2015-03-12 ENCOUNTER — Encounter: Payer: Self-pay | Admitting: *Deleted

## 2015-03-16 ENCOUNTER — Encounter: Payer: Self-pay | Admitting: *Deleted

## 2015-03-17 ENCOUNTER — Other Ambulatory Visit: Payer: Self-pay | Admitting: Physician Assistant

## 2015-03-18 ENCOUNTER — Other Ambulatory Visit: Payer: Self-pay

## 2015-03-18 MED ORDER — LEVOTHYROXINE SODIUM 75 MCG PO TABS: ORAL_TABLET | ORAL | Status: DC

## 2015-03-19 ENCOUNTER — Encounter: Payer: Self-pay | Admitting: *Deleted

## 2015-03-20 ENCOUNTER — Telehealth: Payer: Self-pay

## 2015-03-20 DIAGNOSIS — H43811 Vitreous degeneration, right eye: Secondary | ICD-10-CM | POA: Diagnosis not present

## 2015-03-20 NOTE — Telephone Encounter (Signed)
Pt called wanting a refill on her diazepam (VALIUM) 5 MG tablet [[id number]]. Pharmacy is correct. Please advise at [phone]

## 2015-03-21 MED ORDER — DIAZEPAM 5 MG PO TABS: 5.0000 mg | ORAL_TABLET | Freq: Four times a day (QID) | ORAL | Status: DC | PRN

## 2015-03-21 NOTE — Telephone Encounter (Signed)
Pt would like to know if Dr.Daub can refill her Valium, since Dr.Doolittle is not here today, she states that she has been waiting since the 19th to get this refilled.   Best# [phone]

## 2015-03-21 NOTE — Telephone Encounter (Signed)
Meds ordered this encounter  Medications  . diazepam (VALIUM) 5 MG tablet    Sig: Take 1 tablet (5 mg total) by mouth every 6 (six) hours as needed.    Dispense:  120 tablet    Refill:  5   Okay to phone in

## 2015-03-23 NOTE — Telephone Encounter (Signed)
Called in Rx. Pt notified. 

## 2015-03-25 ENCOUNTER — Encounter: Payer: Self-pay | Admitting: Internal Medicine

## 2015-03-25 ENCOUNTER — Ambulatory Visit (INDEPENDENT_AMBULATORY_CARE_PROVIDER_SITE_OTHER): Payer: Medicare Other | Admitting: Internal Medicine

## 2015-03-25 VITALS — BP 120/72 | HR 80 | Ht 68.5 in | Wt 183.4 lb

## 2015-03-25 DIAGNOSIS — R7989 Other specified abnormal findings of blood chemistry: Secondary | ICD-10-CM | POA: Diagnosis not present

## 2015-03-25 DIAGNOSIS — K760 Fatty (change of) liver, not elsewhere classified: Secondary | ICD-10-CM

## 2015-03-25 DIAGNOSIS — D125 Benign neoplasm of sigmoid colon: Secondary | ICD-10-CM | POA: Diagnosis not present

## 2015-03-25 DIAGNOSIS — R1011 Right upper quadrant pain: Secondary | ICD-10-CM

## 2015-03-25 DIAGNOSIS — R945 Abnormal results of liver function studies: Secondary | ICD-10-CM

## 2015-03-25 NOTE — Patient Instructions (Signed)
Please follow up with Dr. Perry as needed 

## 2015-03-25 NOTE — Progress Notes (Signed)
HISTORY OF PRESENT ILLNESS:  [name] is a 59 y.o. female with past medical history as listed below including disability due to chronic spine disease requiring cervical discectomy. She is also status post remote cholecystectomy. The patient was initially evaluated 04/25/2014 regarding a greater than 10 year history of chronic right upper quadrant pain exacerbated by certain movements. Impression at that time was chronic right upper quadrant pain likely musculoskeletal or possibly related to adhesions. She did undergo screening colonoscopy 07/09/2014. This was normal except for a diminutive sigmoid colon adenomatous polyp which was removed. Follow-up in 5 years recommended. She presents today upon referral by her primary care physician regarding ongoing problems with the same chronic right upper quadrant abdominal pain. Also, the patient has multiple questions regarding a CT scan which was performed 02/25/2015. She reviewed this report through "my chart" and has questions on virtually every aspect of the reading. Again, she describes the right upper quadrant discomfort as constant and dull with intermittent more intense pain. This may or may not be exacerbated by activity, movements, or meals. Laboratories obtained 02/18/2015 revealed normal CBC with differential, TSH, and comprehensive metabolic panel except for an ALT of 57. The aforementioned contrast-enhanced CT scan of the abdomen and pelvis revealed no acute findings. There was mild extrahepatic biliary dilation postcholecystectomy which was similar to previous studies. An incidental small umbilical hernia with fat noted. She does have fatty liver on ultrasound September 2015. She continues with slow gradual weight gain. She does have intermittent reflux symptoms which are mild and do not require medication. No dysphagia. GI review of systems is otherwise negative  REVIEW OF SYSTEMS:  All non-GI ROS negative except for back pain, arthritis,  allergies  Past Medical History  Diagnosis Date  . Depression   . Graves disease     thyroid disease  . Osteoporosis   . Anxiety   . Hyperlipidemia   . Vertigo   . Allergic rhinitis   . Inguinal hernia   . Anemia   . Osteoarthritis   . Gallstones   . Pneumonia   . Heart murmur     as a child  . Carpal tunnel syndrome   . Allergy   . Toxic diffuse goiter   . PTSD (post-traumatic stress disorder)   . Hepatic steatosis   . Colon polyp     adenomatous    Past Surgical History  Procedure Laterality Date  . Appendectomy    . Inguinal hernia repair Bilateral   . Tonsillectomy and adenoidectomy    . Cholecystectomy    . Cervical disc surgery      with cages and stem cell    Social History [name]  reports that she quit smoking about 10 years ago. Her smoking use included Cigarettes. She has never used smokeless tobacco. She reports that she uses illicit drugs (Marijuana). She reports that she does not drink alcohol.  family history includes Atrial fibrillation in her mother; COPD in her mother; Heart disease in her father, paternal grandfather, and paternal grandmother; Lung cancer in her maternal grandmother. There is no history of Colon cancer, Rectal cancer, or Stomach cancer.  Allergies  Allergen Reactions  . Codeine Shortness Of Breath, Nausea And Vomiting and Rash  . Penicillins Anaphylaxis  . Morphine And Related   . Prednisone Other (See Comments)    Dizzy  . Sudafed [Pseudoephedrine Hcl] Other (See Comments)    "Hyper"   . Sulfa Antibiotics Rash       PHYSICAL EXAMINATION: Vital  signs: BP 120/72 mmHg  Pulse 80  Ht 5' 8.5" (1.74 m)  Wt 183 lb 6.4 oz (83.19 kg)  BMI 27.48 kg/m2  Constitutional: generally well-appearing, no acute distress Psychiatric: alert and oriented x3, cooperative Eyes: extraocular movements intact, anicteric, conjunctiva pink Mouth: oral pharynx moist, no lesions Neck: supple no lymphadenopathy Cardiovascular: heart  regular rate and rhythm, no murmur Lungs: clear to auscultation bilaterally Abdomen: soft, obese, mild superficial tenderness in the right upper quadrant to palpation, nondistended, no obvious ascites, no peritoneal signs, normal bowel sounds, no organomegaly. Small umbilical hernia Rectal: Ommitted Extremities: no clubbing cyanosis or lower extremity edema bilaterally Skin: no lesions on visible extremities Neuro: No focal deficits. No asterixis.   ASSESSMENT:  #1. Chronic right upper quadrant pain. As previous, musculoskeletal versus adhesions. #2. Mild extrahepatic biliary dilation. Stable. Physiologic. #3. Fatty liver on ultrasound #4. Mild elevation of ALT likely secondary to fatty liver #5. Diminutive adenoma on colonoscopy November 2015   PLAN:  #1. Explained to the patient that her chronic right upper quadrant pain was benign. Reassurance. #2. Recommended exercise and weight loss for fatty liver. May help her right upper quadrant discomfort as well #3. Reviewed multiple AcipHex of her CT scan with her. Answered multiple questions to her satisfaction #4. Surveillance colonoscopy recommended I November 2020 #5. Resume general medical care with PCP. Ample GI follow-up as needed   25 minutes was spent face-to-face with this patient in her evaluation. Greater than 50% of the time was used for counseling regarding her conditions as well as answering multiple questions

## 2015-04-06 DIAGNOSIS — H1032 Unspecified acute conjunctivitis, left eye: Secondary | ICD-10-CM | POA: Diagnosis not present

## 2015-04-07 ENCOUNTER — Ambulatory Visit (INDEPENDENT_AMBULATORY_CARE_PROVIDER_SITE_OTHER): Payer: Medicare Other | Admitting: Family Medicine

## 2015-04-07 VITALS — BP 126/78 | HR 89 | Temp 98.9°F | Resp 16 | Ht 69.5 in | Wt 181.0 lb

## 2015-04-07 DIAGNOSIS — H60391 Other infective otitis externa, right ear: Secondary | ICD-10-CM | POA: Diagnosis not present

## 2015-04-07 DIAGNOSIS — I889 Nonspecific lymphadenitis, unspecified: Secondary | ICD-10-CM

## 2015-04-07 DIAGNOSIS — H9201 Otalgia, right ear: Secondary | ICD-10-CM | POA: Diagnosis not present

## 2015-04-07 MED ORDER — TRAMADOL HCL 50 MG PO TABS
50.0000 mg | ORAL_TABLET | Freq: Four times a day (QID) | ORAL | Status: DC | PRN
Start: 2015-04-07 — End: 2015-04-07

## 2015-04-07 MED ORDER — OFLOXACIN 0.3 % OT SOLN: 5.0000 [drp] | Freq: Every day | OTIC | Status: DC

## 2015-04-07 MED ORDER — AZITHROMYCIN 250 MG PO TABS: ORAL_TABLET | ORAL | Status: DC

## 2015-04-07 NOTE — Patient Instructions (Addendum)
Use the eardrops 5 drops in your ear twice daily for 3-5 days  Take the azithromycin 2 pills initially, then 1 daily for 4 days  Take Tylenol 500 mg (acetaminophen) 2 pills 3 times daily if needed for pain and/or Aleve (naproxen) 220 mg 2 pills twice daily  Return if worse or not improving  Try to keep from getting water in the ear  Otitis Externa Otitis externa is a bacterial or fungal infection of the outer ear canal. This is the area from the eardrum to the outside of the ear. Otitis externa is sometimes called "swimmer's ear." CAUSES  Possible causes of infection include:  Swimming in dirty water.  Moisture remaining in the ear after swimming or bathing.  Mild injury (trauma) to the ear.  Objects stuck in the ear (foreign body).  Cuts or scrapes (abrasions) on the outside of the ear. SIGNS AND SYMPTOMS  The first symptom of infection is often itching in the ear canal. Later signs and symptoms may include swelling and redness of the ear canal, ear pain, and yellowish-white fluid (pus) coming from the ear. The ear pain may be worse when pulling on the earlobe. DIAGNOSIS  Your health care provider will perform a physical exam. A sample of fluid may be taken from the ear and examined for bacteria or fungi. TREATMENT  Antibiotic ear drops are often given for 10 to 14 days. Treatment may also include pain medicine or corticosteroids to reduce itching and swelling. HOME CARE INSTRUCTIONS   Apply antibiotic ear drops to the ear canal as prescribed by your health care provider.  Take medicines only as directed by your health care provider.  If you have diabetes, follow any additional treatment instructions from your health care provider.  Keep all follow-up visits as directed by your health care provider. PREVENTION   Keep your ear dry. Use the corner of a towel to absorb water out of the ear canal after swimming or bathing.  Avoid scratching or putting objects inside your ear.  This can damage the ear canal or remove the protective wax that lines the canal. This makes it easier for bacteria and fungi to grow.  Avoid swimming in lakes, polluted water, or poorly chlorinated pools.  You may use ear drops made of rubbing alcohol and vinegar after swimming. Combine equal parts of white vinegar and alcohol in a bottle. Put 3 or 4 drops into each ear after swimming. SEEK MEDICAL CARE IF:   You have a fever.  Your ear is still red, swollen, painful, or draining pus after 3 days.  Your redness, swelling, or pain gets worse.  You have a severe headache.  You have redness, swelling, pain, or tenderness in the area behind your ear. MAKE SURE YOU:   Understand these instructions.  Will watch your condition.  Will get help right away if you are not doing well or get worse. Document Released: 08/15/2005 Document Revised: 12/30/2013 Document Reviewed: 09/01/2011 [facility] Patient Information 2015 [place], [place]. This information is not intended to replace advice given to you by your health care provider. Make sure you discuss any questions you have with your health care provider.

## 2015-04-07 NOTE — Progress Notes (Signed)
  Subjective:  Patient ID: [name], female    DOB: 06/22/1956  Age: 59 y.o. MRN: [id number]  59 year old lady who had infection yesterday with to her eye doctor and got some eyedrops. Last night she developed right ear pain. She does not have a history of problems with her years the the old record shows that she had an otitis externa in 2008. She has not been running any fever. The ear is  painful. She has not been sick with a cold or sore throat or cough. She reports below her ear and down into the neck.   Objective:   Pleasant alert lady who has some obvious discomfort. Her left TM is normal. Right ear canal is erythematous and swollen with narrowing of the canal. The TM does not look actively inflamed though it too is a tiny bit pinkish. Neck is supple. Has a tender node below her right ear. There is pain on motion of the right auricle. Chest clear. Heart regular without murmur.  Assessment & Plan:   Assessment:  Otitis externa and cervical lymphadenopathy  Plan:  Ofloxacin drops in the ear. Because of the pain down into the neck went ahead and added oral and  There are no Patient Instructions on file for this visit.   HOPPER,DAVID, MD 04/07/2015

## 2015-04-08 IMAGING — US US ABDOMEN COMPLETE
1 series · 14 of 25 positions shown · non-contrast
Comparison: [REDACTED] Lumbar MRI 11/21/2005
[HOSPITAL] CT Abdomen and Pelvis 05/27/2005.

CLINICAL DATA: 58-year-old female with right upper quadrant
abdominal pain. History of cholecystectomy and appendectomy. Initial
encounter.

EXAM:
ULTRASOUND ABDOMEN COMPLETE

[Series 1: us abdomen complete · 0.22mm/px · 14 of 63 slices shown]
[im 1/63]
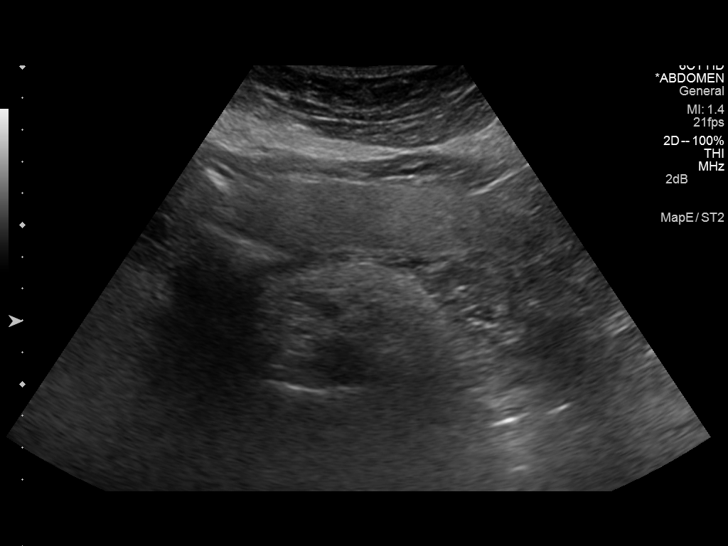
[im 6/63]
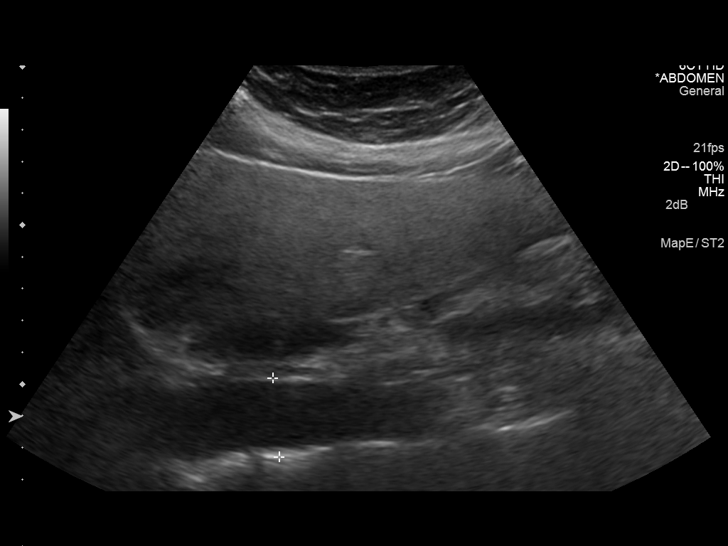
[im 11/63]
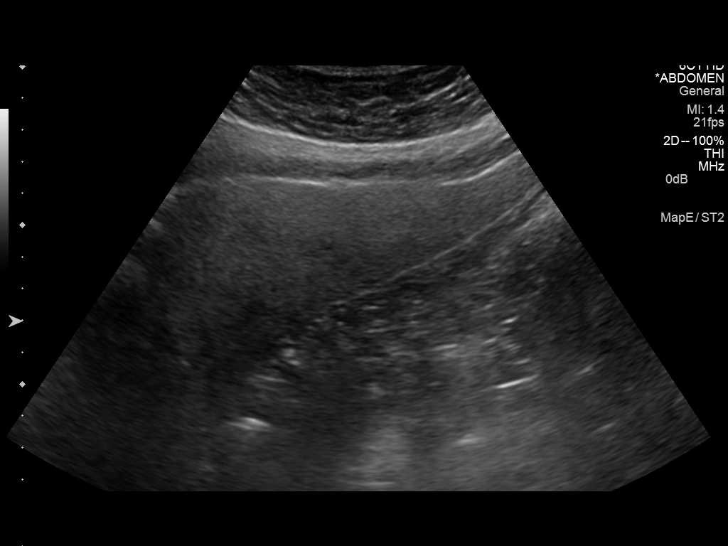
[im 16/63]
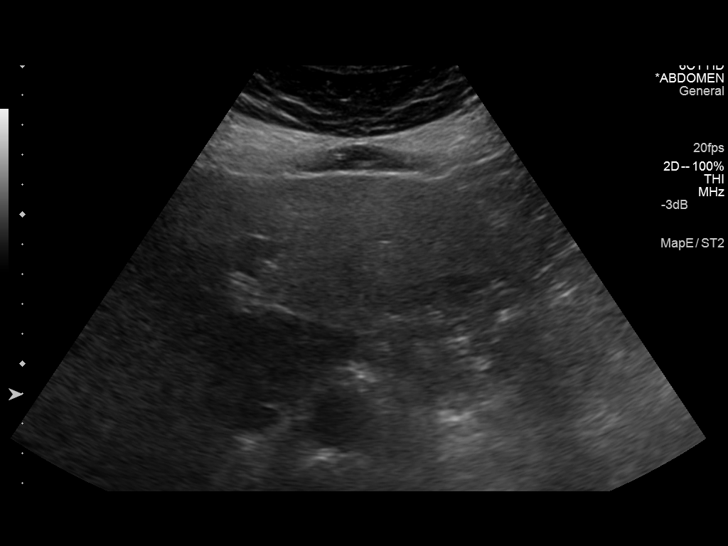
[im 21/63]
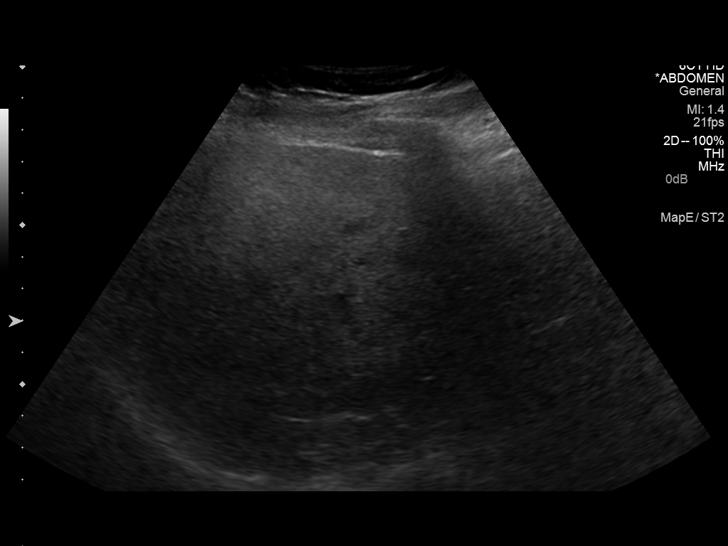
[im 24/63]
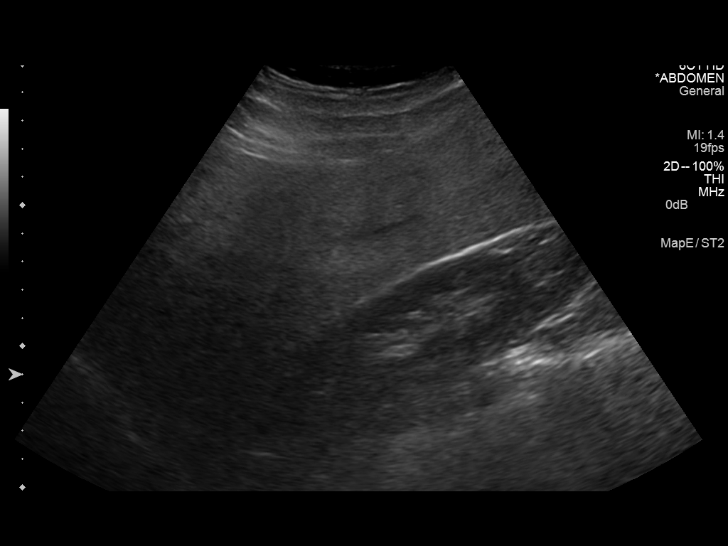
[im 29/63]
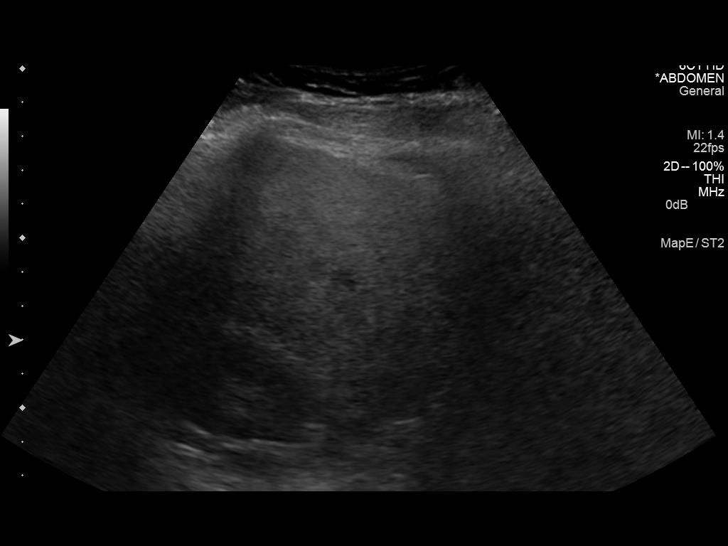
[im 34/63]
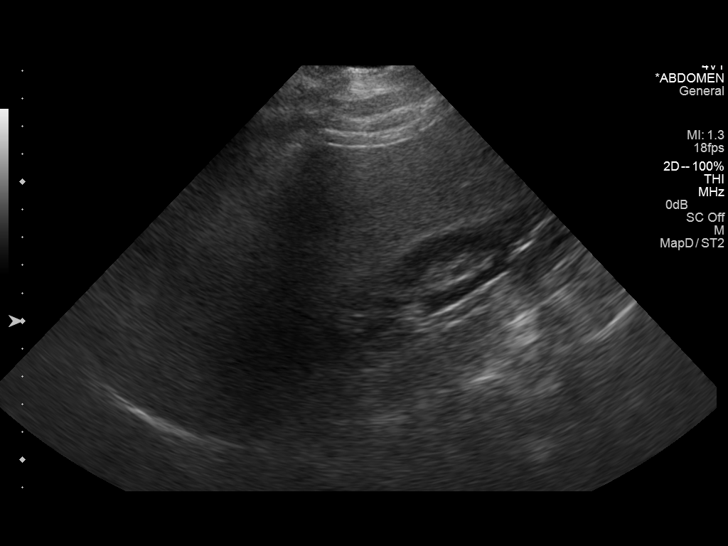
[im 39/63]
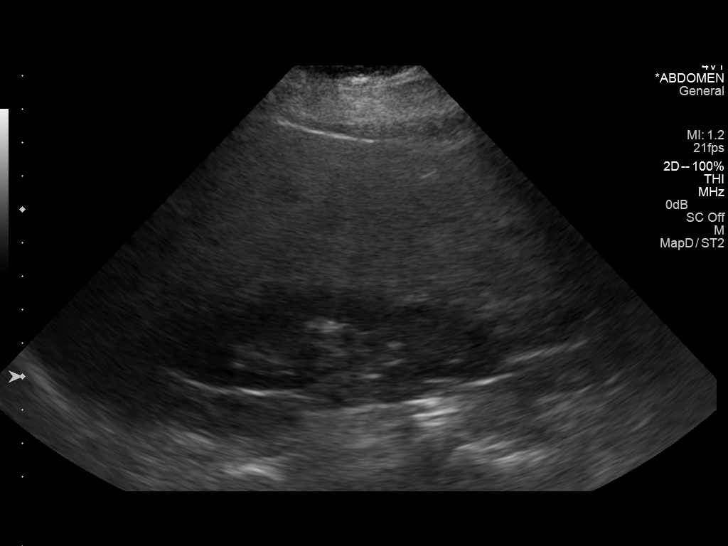
[im 42/63]
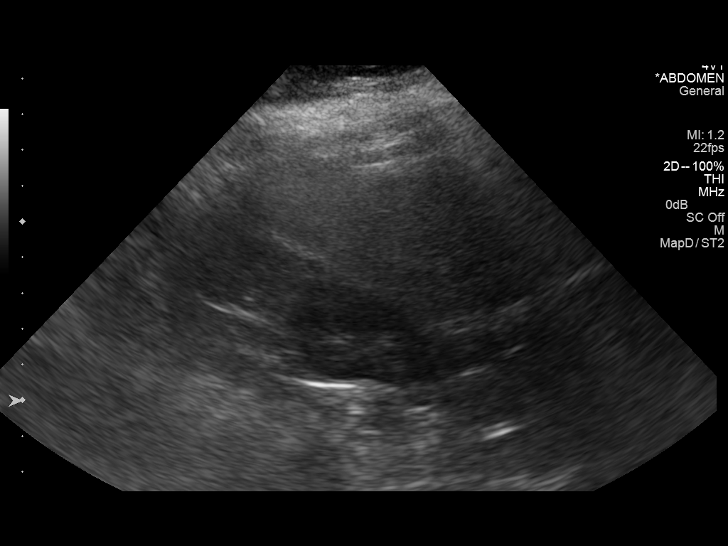
[im 47/63]
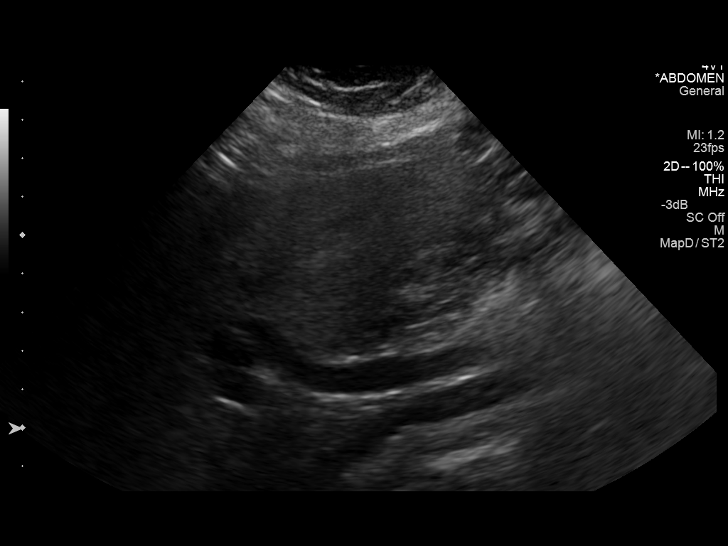
[im 52/63]
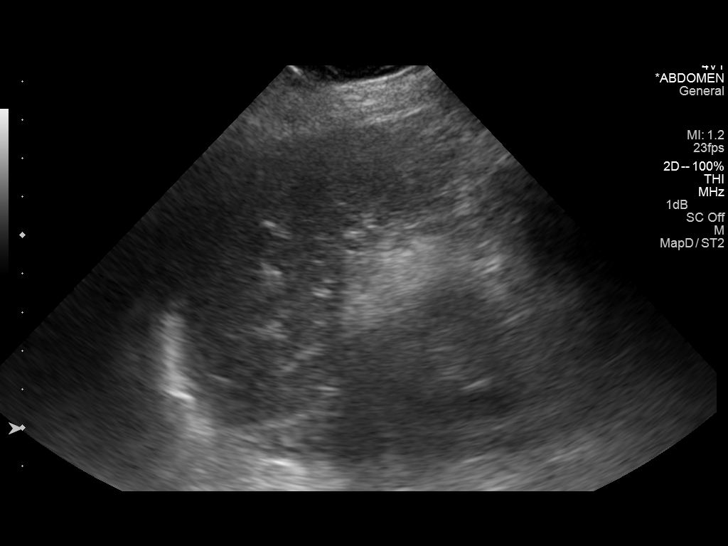
[im 57/63]
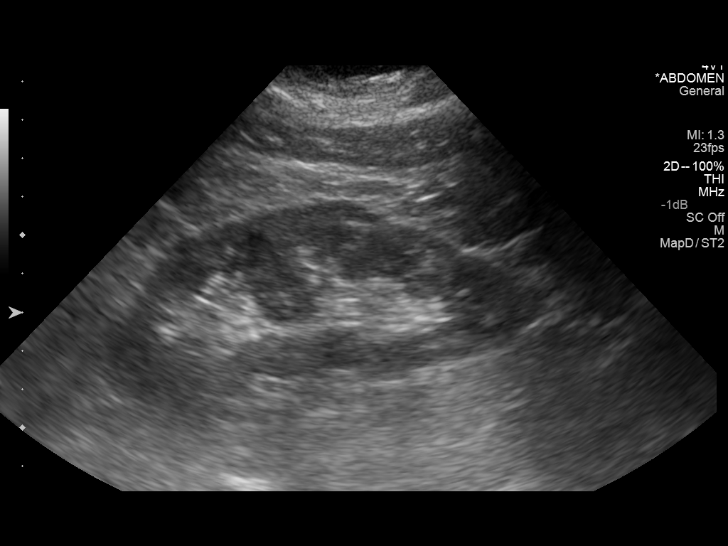
[im 63/63]
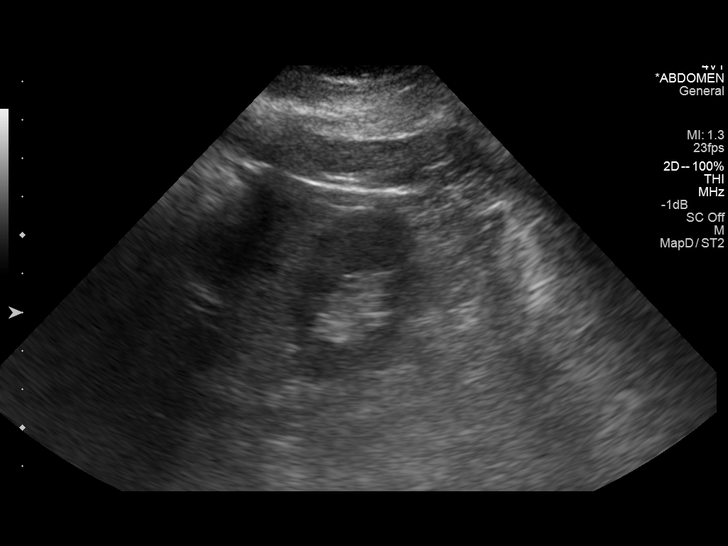

[14 of 25 positions shown; findings below may reference images not displayed]

FINDINGS: Gallbladder:

Surgically absent

Common bile duct:

Diameter: 9 mm, up to 8 mm in 5886 and 7449.

Liver:

Diffusely increased echogenicity, and dense echotexture somewhat
difficult to penetrate. No intrahepatic biliary ductal dilatation.
The caudate lobe has more normal echotexture, suggesting fatty
sparing. Otherwise no discrete liver lesion.

IVC:

No abnormality visualized.

Pancreas:

Visualized portion unremarkable.

Spleen:

Size and appearance within normal limits.

Right Kidney:

Length: 11.6 cm. Echogenicity within normal limits. No mass or
hydronephrosis visualized.

Left Kidney:

Length: 11.2 cm. Echogenicity within normal limits. No mass or
hydronephrosis visualized.

Abdominal aorta:

No aneurysm visualized.

Other findings:

None.
IMPRESSION: 1. Hepatic steatosis.  Suspect fatty sparing of the caudate.
2. Otherwise negative status post cholecystectomy.

## 2015-04-14 ENCOUNTER — Telehealth: Payer: Self-pay

## 2015-04-14 DIAGNOSIS — G8929 Other chronic pain: Secondary | ICD-10-CM

## 2015-04-14 DIAGNOSIS — M542 Cervicalgia: Principal | ICD-10-CM

## 2015-04-14 NOTE — Telephone Encounter (Signed)
Pt is needing a refill refill on meperidine   Best number once ready to pic up  [phone]

## 2015-04-14 NOTE — Telephone Encounter (Signed)
On 6/22 gave rx for 3 months   3 prescripts Did something happen?

## 2015-04-15 MED ORDER — MEPERIDINE HCL 100 MG PO TABS: 100.0000 mg | ORAL_TABLET | Freq: Four times a day (QID) | ORAL | Status: DC | PRN

## 2015-04-15 NOTE — Telephone Encounter (Signed)
Meds ordered this encounter  Medications  . meperidine (DEMEROL) 100 MG tablet    Sig: Take 1 tablet (100 mg total) by mouth every 6 (six) hours as needed for pain.    Dispense:  120 tablet    Refill:  0  . meperidine (DEMEROL) 100 MG tablet    Sig: Take 1 tablet (100 mg total) by mouth every 6 (six) hours as needed for pain. For 30 d after signed    Dispense:  120 tablet    Refill:  0  . meperidine (DEMEROL) 100 MG tablet    Sig: Take 1 tablet (100 mg total) by mouth every 6 (six) hours as needed for pain. For 60d after signed    Dispense:  120 tablet    Refill:  0

## 2015-04-15 NOTE — Telephone Encounter (Signed)
Patient states that she only received two scripts not three.

## 2015-04-16 NOTE — Telephone Encounter (Signed)
In your box to be signed.

## 2015-04-17 NOTE — Telephone Encounter (Signed)
Notified pt Rxs are ready.

## 2015-07-24 ENCOUNTER — Telehealth: Payer: Self-pay

## 2015-07-24 DIAGNOSIS — M542 Cervicalgia: Principal | ICD-10-CM

## 2015-07-24 DIAGNOSIS — G8929 Other chronic pain: Secondary | ICD-10-CM

## 2015-07-24 MED ORDER — MEPERIDINE HCL 100 MG PO TABS: 100.0000 mg | ORAL_TABLET | Freq: Four times a day (QID) | ORAL | Status: DC | PRN

## 2015-07-24 NOTE — Telephone Encounter (Signed)
Meds ordered this encounter  Medications  . meperidine (DEMEROL) 100 MG tablet    Sig: Take 1 tablet (100 mg total) by mouth every 6 (six) hours as needed for pain.    Dispense:  120 tablet    Refill:  0  . meperidine (DEMEROL) 100 MG tablet    Sig: Take 1 tablet (100 mg total) by mouth every 6 (six) hours as needed for pain. For 30 d after signed    Dispense:  120 tablet    Refill:  0  . meperidine (DEMEROL) 100 MG tablet    Sig: Take 1 tablet (100 mg total) by mouth every 6 (six) hours as needed for pain. For 60d after signed    Dispense:  120 tablet    Refill:  0   May pick up

## 2015-07-24 NOTE — Telephone Encounter (Signed)
Pt needs a refill on Meperidine.  Please advise   [phone]

## 2015-07-27 ENCOUNTER — Encounter: Payer: Self-pay | Admitting: Internal Medicine

## 2015-07-27 NOTE — Telephone Encounter (Signed)
Notified pt ready. 

## 2015-08-10 ENCOUNTER — Other Ambulatory Visit: Payer: Self-pay | Admitting: Internal Medicine

## 2015-08-10 DIAGNOSIS — Z1231 Encounter for screening mammogram for malignant neoplasm of breast: Secondary | ICD-10-CM | POA: Diagnosis not present

## 2015-08-10 DIAGNOSIS — R1030 Lower abdominal pain, unspecified: Secondary | ICD-10-CM | POA: Diagnosis not present

## 2015-08-10 DIAGNOSIS — Z6826 Body mass index (BMI) 26.0-26.9, adult: Secondary | ICD-10-CM | POA: Diagnosis not present

## 2015-08-10 LAB — HM MAMMOGRAPHY

## 2015-08-14 ENCOUNTER — Other Ambulatory Visit: Payer: Self-pay | Admitting: Internal Medicine

## 2015-08-19 ENCOUNTER — Ambulatory Visit (INDEPENDENT_AMBULATORY_CARE_PROVIDER_SITE_OTHER): Payer: Medicare Other | Admitting: Internal Medicine

## 2015-08-19 ENCOUNTER — Encounter: Payer: Self-pay | Admitting: Internal Medicine

## 2015-08-19 VITALS — BP 126/86 | HR 96 | Temp 98.6°F | Resp 16 | Ht 70.0 in | Wt 176.0 lb

## 2015-08-19 DIAGNOSIS — H9191 Unspecified hearing loss, right ear: Secondary | ICD-10-CM

## 2015-08-19 DIAGNOSIS — Z1159 Encounter for screening for other viral diseases: Secondary | ICD-10-CM

## 2015-08-19 DIAGNOSIS — L989 Disorder of the skin and subcutaneous tissue, unspecified: Secondary | ICD-10-CM | POA: Diagnosis not present

## 2015-08-19 DIAGNOSIS — F332 Major depressive disorder, recurrent severe without psychotic features: Secondary | ICD-10-CM

## 2015-08-19 DIAGNOSIS — G8929 Other chronic pain: Secondary | ICD-10-CM

## 2015-08-19 DIAGNOSIS — M542 Cervicalgia: Secondary | ICD-10-CM

## 2015-08-19 DIAGNOSIS — M1990 Unspecified osteoarthritis, unspecified site: Secondary | ICD-10-CM

## 2015-08-19 DIAGNOSIS — E785 Hyperlipidemia, unspecified: Secondary | ICD-10-CM | POA: Diagnosis not present

## 2015-08-19 DIAGNOSIS — M19049 Primary osteoarthritis, unspecified hand: Secondary | ICD-10-CM

## 2015-08-19 DIAGNOSIS — E039 Hypothyroidism, unspecified: Secondary | ICD-10-CM

## 2015-08-19 DIAGNOSIS — Z114 Encounter for screening for human immunodeficiency virus [HIV]: Secondary | ICD-10-CM | POA: Diagnosis not present

## 2015-08-19 LAB — POCT SKIN KOH: Skin KOH, POC: NEGATIVE

## 2015-08-19 LAB — CBC WITH DIFFERENTIAL/PLATELET
BASOS ABS: 0.1 10*3/uL (ref 0.0–0.1)
Basophils Relative: 1 % (ref 0–1)
Eosinophils Absolute: 0.1 10*3/uL (ref 0.0–0.7)
Eosinophils Relative: 2 % (ref 0–5)
HEMATOCRIT: 43.8 % (ref 36.0–46.0)
HEMOGLOBIN: 14.3 g/dL (ref 12.0–15.0)
LYMPHS PCT: 46 % (ref 12–46)
Lymphs Abs: 2.9 10*3/uL (ref 0.7–4.0)
MCH: 30.1 pg (ref 26.0–34.0)
MCHC: 32.6 g/dL (ref 30.0–36.0)
MCV: 92.2 fL (ref 78.0–100.0)
MONO ABS: 0.4 10*3/uL (ref 0.1–1.0)
MPV: 10.5 fL (ref 8.6–12.4)
Monocytes Relative: 7 % (ref 3–12)
Neutro Abs: 2.8 10*3/uL (ref 1.7–7.7)
Neutrophils Relative %: 44 % (ref 43–77)
Platelets: 298 10*3/uL (ref 150–400)
RBC: 4.75 MIL/uL (ref 3.87–5.11)
RDW: 13.6 % (ref 11.5–15.5)
WBC: 6.4 10*3/uL (ref 4.0–10.5)

## 2015-08-19 MED ORDER — TRIAMCINOLONE ACETONIDE 0.1 % EX CREA: 1.0000 "application " | TOPICAL_CREAM | Freq: Two times a day (BID) | CUTANEOUS | Status: DC

## 2015-08-19 MED ORDER — DIAZEPAM 5 MG PO TABS: 5.0000 mg | ORAL_TABLET | Freq: Four times a day (QID) | ORAL | Status: DC | PRN

## 2015-08-19 MED ORDER — MEPERIDINE HCL 100 MG PO TABS: 100.0000 mg | ORAL_TABLET | Freq: Four times a day (QID) | ORAL | Status: DC | PRN

## 2015-08-19 NOTE — Progress Notes (Signed)
Subjective:    Patient ID: [name], female    DOB: October 04, 1955, 59 y.o.   MRN: [id number]  HPIf/u Patient Active Problem List   Diagnosis Date Noted  . Hypothyroidism 02/18/2015    Priority: Medium----time for labs  . Allergic rhinitis due to pollen 10/26/2011  . Chronic neck pain-2011 MRI Disc degeneration and spondylosis, most prominent at C5-6 and C6-7. There is moderate left foraminal narrowing at C5-6 and C6-7 due to spurring---long hx rx including injections and failed pain management per Nicholaus Bloom due to side effects. Now stable since 2013 on demerol. 10/26/2011  . [place] arthritis---Dr Veverly Fells 2013 10/26/2011  . CTS (carpal tunnel syndrome) 10/26/2011  . PTSD (post-traumatic stress disorder) 10/26/2011  . Hyperlipemia 10/26/2011  . Hearing loss in right ear 10/26/2011  . Major depressive disorder, recurrent episode ([place]) 10/26/2006  . OTITIS EXTERNA, UNSPECIFIED 10/26/2006    -  dexa 2011=low bone mass--not interested in fossamax  Doing well/positive attitude speaks wonders Still caring for invalid 45yo Mom and trouble provided by kids Still struggles with pain musculoskeletal   Dr Wein--gyn--utd re pap mammo Declines zostavax No hiv risks ever  Review of Systems No new issues except skin lesion L arm 2-3weeks that itches Endo neg    Objective:   Physical Exam  Constitutional: She is oriented to person, place, and time. She appears well-developed and well-nourished. No distress.  HENT:  Head: Normocephalic and atraumatic.  Eyes: Pupils are equal, round, and reactive to light.  Neck: Normal range of motion.  Cardiovascular: Normal rate and regular rhythm.   Pulmonary/Chest: Effort normal. No respiratory distress.  Musculoskeletal: Normal range of motion.  Neurological: She is alert and oriented to person, place, and time.  Skin: Skin is warm and dry.  L upper arm with 1 cm round patch, scaly, red KOH neg  Psychiatric: She has a normal mood and affect.  Her behavior is normal.  Nursing note and vitals reviewed.  BP 126/86 mmHg  Pulse 96  Temp(Src) 98.6 F (37 C)  Resp 16  Ht '5\' 10"'  (1.778 m)  Wt 176 lb (79.833 kg)  BMI 25.25 kg/m2        Assessment & Plan:  Hypothyroidism, unspecified hypothyroidism type -labs!  Hyperlipemia - on lipitor 40  [place] arthritis--stable  Chronic neck pain - Plan: meperidine (DEMEROL) 100 MG tablet  Hearing loss in right ear-stable  Severe episode of recurrent major depressive disorder, without psychotic features (HCC)--this past dx now stable  Need for hepatitis C screening test - Plan: Hepatitis C antibody  Medicare wouldn't cover HIV screening BUT she doesn't need it anyway.  Skin lesion - favors fixed drug reaction vs single lesion nummular eczema --topical triam and follow   Addendum labs--no need to increase lipitor yet- she will try more diet modification Results for orders placed or performed in visit on 08/19/15  CBC with Differential/Platelet  Result Value Ref Range   WBC 6.4 4.0 - 10.5 K/uL   RBC 4.75 3.87 - 5.11 MIL/uL   Hemoglobin 14.3 12.0 - 15.0 g/dL   HCT 43.8 36.0 - 46.0 %   MCV 92.2 78.0 - 100.0 fL   MCH 30.1 26.0 - 34.0 pg   MCHC 32.6 30.0 - 36.0 g/dL   RDW 13.6 11.5 - 15.5 %   Platelets 298 150 - 400 K/uL   MPV 10.5 8.6 - 12.4 fL   Neutrophils Relative % 44 43 - 77 %   Neutro Abs 2.8 1.7 - 7.7 K/uL  Lymphocytes Relative 46 12 - 46 %   Lymphs Abs 2.9 0.7 - 4.0 K/uL   Monocytes Relative 7 3 - 12 %   Monocytes Absolute 0.4 0.1 - 1.0 K/uL   Eosinophils Relative 2 0 - 5 %   Eosinophils Absolute 0.1 0.0 - 0.7 K/uL   Basophils Relative 1 0 - 1 %   Basophils Absolute 0.1 0.0 - 0.1 K/uL   Smear Review Criteria for review not met   Comprehensive metabolic panel  Result Value Ref Range   Sodium 137 135 - 146 mmol/L   Potassium 4.6 3.5 - 5.3 mmol/L   Chloride 98 98 - 110 mmol/L   CO2 28 20 - 31 mmol/L   Glucose, Bld 94 65 - 99 mg/dL   BUN 7 7 - 25 mg/dL   Creat  0.86 0.50 - 0.99 mg/dL   Total Bilirubin 0.4 0.2 - 1.2 mg/dL   Alkaline Phosphatase 94 33 - 130 U/L   AST 25 10 - 35 U/L   ALT 36 (H) 6 - 29 U/L   Total Protein 7.8 6.1 - 8.1 g/dL   Albumin 4.7 3.6 - 5.1 g/dL   Calcium 9.8 8.6 - 10.4 mg/dL  Lipid panel  Result Value Ref Range   Cholesterol 220 (H) 125 - 200 mg/dL   Triglycerides 158 (H) <150 mg/dL   HDL 45 (L) >=46 mg/dL   Total CHOL/HDL Ratio 4.9 <=5.0 Ratio   VLDL 32 (H) <30 mg/dL   LDL Cholesterol 143 (H) <130 mg/dL  TSH  Result Value Ref Range   TSH 7.072 (H) 0.350 - 4.500 uIU/mL  T4, free  Result Value Ref Range   Free T4 1.01 0.80 - 1.80 ng/dL  Hepatitis C antibody  Result Value Ref Range   HCV Ab NEGATIVE NEGATIVE  POCT Skin KOH  Result Value Ref Range   Skin KOH, POC Negative    Will budge thyr repl up to 36mg F/u spring to set up transition

## 2015-08-20 LAB — LIPID PANEL
Cholesterol: 220 mg/dL — ABNORMAL HIGH (ref 125–200)
HDL: 45 mg/dL — AB (ref >=46)
LDL Cholesterol: 143 mg/dL — ABNORMAL HIGH (ref <130)
TRIGLYCERIDES: 158 mg/dL — AB (ref <150)
Total CHOL/HDL Ratio: 4.9 Ratio (ref <=5.0)
VLDL: 32 mg/dL — ABNORMAL HIGH (ref <30)

## 2015-08-20 LAB — COMPREHENSIVE METABOLIC PANEL
ALBUMIN: 4.7 g/dL (ref 3.6–5.1)
ALK PHOS: 94 U/L (ref 33–130)
ALT: 36 U/L — AB (ref 6–29)
AST: 25 U/L (ref 10–35)
BILIRUBIN TOTAL: 0.4 mg/dL (ref 0.2–1.2)
BUN: 7 mg/dL (ref 7–25)
CALCIUM: 9.8 mg/dL (ref 8.6–10.4)
CO2: 28 mmol/L (ref 20–31)
CREATININE: 0.86 mg/dL (ref 0.50–0.99)
Chloride: 98 mmol/L (ref 98–110)
Glucose, Bld: 94 mg/dL (ref 65–99)
Potassium: 4.6 mmol/L (ref 3.5–5.3)
SODIUM: 137 mmol/L (ref 135–146)
Total Protein: 7.8 g/dL (ref 6.1–8.1)

## 2015-08-20 LAB — T4, FREE: FREE T4: 1.01 ng/dL (ref 0.80–1.80)

## 2015-08-20 LAB — TSH: TSH: 7.072 u[IU]/mL — AB (ref 0.350–4.500)

## 2015-08-20 LAB — HEPATITIS C ANTIBODY: HCV Ab: NEGATIVE

## 2015-08-21 MED ORDER — LEVOTHYROXINE SODIUM 88 MCG PO TABS: 88.0000 ug | ORAL_TABLET | Freq: Every day | ORAL | Status: DC

## 2015-09-02 MED ORDER — ATORVASTATIN CALCIUM 80 MG PO TABS: 80.0000 mg | ORAL_TABLET | Freq: Every day | ORAL | Status: DC

## 2015-09-02 NOTE — Addendum Note (Signed)
Addended by: Leandrew Koyanagi on: 09/02/2015 04:50 PM   Modules accepted: Orders

## 2015-09-07 ENCOUNTER — Other Ambulatory Visit: Payer: Self-pay | Admitting: Internal Medicine

## 2015-09-23 ENCOUNTER — Other Ambulatory Visit: Payer: Self-pay | Admitting: Internal Medicine

## 2015-09-29 ENCOUNTER — Telehealth: Payer: Self-pay

## 2015-09-29 NOTE — Telephone Encounter (Signed)
Pharm faxed notice that they did not have enough meperidine to fill the last Rx for #120. They gave pt a partial fill of #114 on 09/25/15.

## 2015-11-16 ENCOUNTER — Telehealth: Payer: Self-pay

## 2015-11-16 ENCOUNTER — Telehealth: Payer: Self-pay | Admitting: Internal Medicine

## 2015-11-16 DIAGNOSIS — G8929 Other chronic pain: Secondary | ICD-10-CM

## 2015-11-16 DIAGNOSIS — M542 Cervicalgia: Principal | ICD-10-CM

## 2015-11-16 MED ORDER — MEPERIDINE HCL 100 MG PO TABS: 100.0000 mg | ORAL_TABLET | Freq: Four times a day (QID) | ORAL | Status: DC | PRN

## 2015-11-16 MED ORDER — TRIAMCINOLONE ACETONIDE 0.1 % EX CREA: 1.0000 "application " | TOPICAL_CREAM | Freq: Two times a day (BID) | CUTANEOUS | Status: DC

## 2015-11-16 NOTE — Telephone Encounter (Signed)
Can pick up mar apr and may

## 2015-11-16 NOTE — Addendum Note (Signed)
Addended by: Leandrew Koyanagi on: 11/16/2015 07:10 PM   Modules accepted: Orders

## 2015-11-16 NOTE — Telephone Encounter (Signed)
Pt states she is in need of her DEMEROL 100 MG for March. Please call [phone] when ready for pick up

## 2015-11-16 NOTE — Telephone Encounter (Signed)
Meds ordered this encounter  Medications  . meperidine (DEMEROL) 100 MG tablet    Sig: Take 1 tablet (100 mg total) by mouth every 6 (six) hours as needed for pain.    Dispense:  120 tablet    Refill:  0  . triamcinolone cream (KENALOG) 0.1 %    Sig: Apply 1 application topically 2 (two) times daily.    Dispense:  30 g    Refill:  0  . meperidine (DEMEROL) 100 MG tablet    Sig: Take 1 tablet (100 mg total) by mouth every 6 (six) hours as needed for pain. For 12/17/15 or after    Dispense:  120 tablet    Refill:  0  . meperidine (DEMEROL) 100 MG tablet    Sig: Take 1 tablet (100 mg total) by mouth every 6 (six) hours as needed for pain. For 01/16/16 or after    Dispense:  120 tablet    Refill:  0

## 2015-11-17 ENCOUNTER — Telehealth: Payer: Self-pay

## 2015-11-17 NOTE — Telephone Encounter (Signed)
Would you ask her if she is willing to try oxy as we could use long acting eventually BUT she may have failed percocet in the past

## 2015-11-17 NOTE — Telephone Encounter (Signed)
I called [name] and she reported that she just got a letter from [employer] stating that they [place] for demerol through 11/12/16. [name] also reported that she HAS tried Oxycodone in the past when her dentist Rxd it to her and it made her EXTREMELY sick and she could not take it. This info will help [place] get approval for demerol next year.  Dr Laney Pastor, [facility] [place]

## 2015-11-17 NOTE — Telephone Encounter (Signed)
Notified pt ready. 

## 2015-11-17 NOTE — Telephone Encounter (Signed)
PA needed for demerol again. Pt has tried/failed dilaudid (ineffective) and morphine (N/V). Pt has not tried Oxycodone because it was not preferred when pt was started on demerol, and after that didn't want to change effective therapy. I started the PA on covermymeds after getting ID # [id number] from pharm. PA form asks: If all formulary agents would not be effective, or if all formulary agents would have adverse effects. It looks like the formulary alternatives are Endocet, Hydromorphone (ineffective), Oxycodone caps or tablets. Dr Laney Pastor, before you started pt on demerol years ago you tried to put pt on Oxy, but it was not a preferred med at the time. Do you want to Rx it now, or try the PA first.

## 2015-11-29 ENCOUNTER — Other Ambulatory Visit: Payer: Self-pay | Admitting: Internal Medicine

## 2015-12-09 ENCOUNTER — Encounter: Payer: Self-pay | Admitting: Internal Medicine

## 2015-12-09 ENCOUNTER — Ambulatory Visit (INDEPENDENT_AMBULATORY_CARE_PROVIDER_SITE_OTHER): Payer: Medicare Other | Admitting: Internal Medicine

## 2015-12-09 VITALS — BP 145/88 | HR 94 | Temp 98.3°F | Resp 16 | Ht 70.0 in | Wt 178.0 lb

## 2015-12-09 DIAGNOSIS — M545 Low back pain, unspecified: Secondary | ICD-10-CM

## 2015-12-09 DIAGNOSIS — J3489 Other specified disorders of nose and nasal sinuses: Secondary | ICD-10-CM | POA: Diagnosis not present

## 2015-12-09 DIAGNOSIS — G8929 Other chronic pain: Secondary | ICD-10-CM | POA: Diagnosis not present

## 2015-12-09 DIAGNOSIS — R0600 Dyspnea, unspecified: Secondary | ICD-10-CM | POA: Diagnosis not present

## 2015-12-09 DIAGNOSIS — F331 Major depressive disorder, recurrent, moderate: Secondary | ICD-10-CM | POA: Diagnosis not present

## 2015-12-09 DIAGNOSIS — E038 Other specified hypothyroidism: Secondary | ICD-10-CM

## 2015-12-09 DIAGNOSIS — M542 Cervicalgia: Secondary | ICD-10-CM

## 2015-12-09 MED ORDER — AMITRIPTYLINE HCL 150 MG PO TABS: 150.0000 mg | ORAL_TABLET | Freq: Every day | ORAL | Status: DC

## 2015-12-09 MED ORDER — MOMETASONE FUROATE 50 MCG/ACT NA SUSP: NASAL | Status: DC

## 2015-12-09 MED ORDER — MEPERIDINE HCL 100 MG PO TABS: 100.0000 mg | ORAL_TABLET | Freq: Four times a day (QID) | ORAL | Status: DC | PRN

## 2015-12-09 MED ORDER — CYCLOBENZAPRINE HCL 10 MG PO TABS: 10.0000 mg | ORAL_TABLET | Freq: Three times a day (TID) | ORAL | Status: DC | PRN

## 2015-12-09 NOTE — Patient Instructions (Addendum)
Followup with Harrison Mons PA-C ---call to see when available    IF you received an x-ray today, you will receive an invoice from [facility]. Please contact [facility] at [phone] with questions or concerns regarding your invoice.   IF you received labwork today, you will receive an invoice from [employer]. Please contact [facility] at [phone] with questions or concerns regarding your invoice.   Our billing staff will not be able to assist you with questions regarding bills from these companies.  You will be contacted with the lab results as soon as they are available. The fastest way to get your results is to activate your My Chart account. Instructions are located on the last page of this paperwork. If you have not heard from [place] regarding the results in 2 weeks, please contact this office.

## 2015-12-11 DIAGNOSIS — M545 Low back pain, unspecified: Secondary | ICD-10-CM | POA: Insufficient documentation

## 2015-12-11 NOTE — Progress Notes (Addendum)
Subjective:    Patient ID: [name], female    DOB: 08-Aug-1956, 60 y.o.   MRN: [id number]  HPI follow-up medication refill Chief Complaint  Patient presents with  . Follow-up  . Back Pain  . Thyroid Problem   She continues to have significant pain in her neck despite medication. She had cages placed by Dr. Vertell Limber who she last saw in 2012 she gets intermittent pain throughout the day with activity and has started using her brace again for help. He still has to do a lot caring for her invalid mother and her daughter moved back in with her 76-year-old granddaughter So home is very busy and stressful- mom is now on oxygen 24 hours a day.  She also has had significant low back pain, more of an ache across the middle with some radiation or shooting pain occasionally to both hip areas but no change in gait or numbness or tingling in extremities. No weakness.  She thinks she has gained weight and she has become much more sedentary, however the chart does not bear this out. Wt Readings from Last 3 Encounters:  12/09/15 178 lb (80.74 kg)  08/19/15 176 lb (79.833 kg)  04/07/15 181 lb (82.101 kg)   She feels like she tires to easily any shortness of breath mild activity. She has not had a past history of heart problems or lung problems. No chest pain with activity and no palpitations. No peripheral edema.  She also has noticed a tender area in her left nostril for several days some swelling and eventually had some pus drainage and is now better.  Patient Active Problem List   Diagnosis Date Noted  . Hypothyroidism 02/18/2015    Labs were stable in December   . Allergic rhinitis due to pollen [place] problems and started  10/26/2011  . Chronic neck pain 10/26/2011  . [name] arthritis---Continues to be painful  10/26/2011  . CTS (carpal tunnel syndrome)---Is not having much trouble at this point  10/26/2011  . PTSD (post-traumatic stress disorder) 10/26/2011  . Hyperlipemia 10/26/2011  .  Hearing loss in right ear 10/26/2011  . Major depressive disorder, recurrent episode ([place])--- she continues to struggle with her depression as her life is very difficult above and beyond her chronic pain. She does have things mean a lot to her and good relationships and so she is not despondent.  10/26/2006    Review of Systems  Constitutional: Positive for activity change and fatigue. Negative for fever, appetite change and unexpected weight change.  HENT: Negative for trouble swallowing.   Eyes: Negative for visual disturbance.  Respiratory: Negative for cough and wheezing.   Cardiovascular: Negative for chest pain, palpitations and leg swelling.  Gastrointestinal: Negative for abdominal pain.  Genitourinary: Negative for difficulty urinating.  Neurological: Negative for headaches.  Psychiatric/Behavioral: Negative for confusion and sleep disturbance.       Objective:   Physical Exam BP 145/88 mmHg  Pulse 94  Temp(Src) 98.3 F (36.8 C)  Resp 16  Ht 5\' 10"  (1.778 m)  Wt 178 lb (80.74 kg)  BMI 25.54 kg/m2 The peak blood pressure 155/92 HEENT clear including the left nostril which has a healing area on the lateral wall  No thyromegaly or lymphadenopathy  CVR-heart regular without murmur Lungs clear to auscultation  Neck range of motion is restricted by pain Tender lumbar with negative straight leg raise and preserved distal function in extremities  Cranial nerves II through XII intact Her mood is good and affect  with a normal thought content     Assessment & Plan:  Chronic neck pain - Plan: meperidine (DEMEROL) 100 MG tablet, cyclobenzaprine (FLEXERIL) 10 MG tablet --She has had bad systemic effects from other narcotics /this has been continued long-term --She may desire consult with Dr. Vertell Limber again  Other specified hypothyroidism --No change medication//level thyroxine 88 g  Pain, nose --This appears to be an intranasal abscess that appears to be resolving. She is  not totally well in 3 weeks she will call for ENT referral  Dyspnea --She has a history of hyperlipidemia and is on Lipitor 80. At this point it is likely that her dyspnea is secondary to deconditioning rather than heart disease. She will adopt a graded exercise program starting with walking for 15 minutes a day and if her dyspnea worsens rather than improves or she develops chest pain, we will set up cardiac evaluation.  Midline low back pain without sciatica --At this point starting with physical therapy, physical activity recommended  Major depressive disorder, recurrent episode, moderate ([place]) --She will continue medication//amitriptyline refilled///she may call for refill of Valium when that prescription expires  Mild elevation of blood pressure-uncharacteristic for her --Home blood pressures/sen records if above 130/80 Meds ordered this encounter  Medications  . meperidine (DEMEROL) 100 MG tablet    Sig: Take 1 tablet (100 mg total) by mouth every 6 (six) hours as needed for pain. For 02/16/16    Dispense:  120 tablet    Refill:  0  . meperidine (DEMEROL) 100 MG tablet    Sig: Take 1 tablet (100 mg total) by mouth every 6 (six) hours as needed for pain. For 03/17/16 or after    Dispense:  120 tablet    Refill:  0  . meperidine (DEMEROL) 100 MG tablet    Sig: Take 1 tablet (100 mg total) by mouth every 6 (six) hours as needed for pain. For 04/17/16 or after    Dispense:  120 tablet    Refill:  0  . cyclobenzaprine (FLEXERIL) 10 MG tablet    Sig: Take 1 tablet (10 mg total) by mouth 3 (three) times daily as needed. for muscle spams    Dispense:  90 tablet    Refill:  5  . mometasone (NASONEX) 50 MCG/ACT nasal spray    Sig: INSTILL 2 SPRAYS IN EACH NOSTRIL DAILY    Dispense:  17 g    Refill:  9  . amitriptyline (ELAVIL) 150 MG tablet    Sig: Take 1 tablet (150 mg total) by mouth at bedtime.    Dispense:  90 tablet    Refill:  3   She may call in 3 months for refills and will  need follow-up in 6 months--can repeat labs at that point

## 2015-12-17 ENCOUNTER — Telehealth: Payer: Self-pay

## 2015-12-17 DIAGNOSIS — G8929 Other chronic pain: Secondary | ICD-10-CM

## 2015-12-17 DIAGNOSIS — M545 Low back pain, unspecified: Secondary | ICD-10-CM

## 2015-12-17 DIAGNOSIS — M542 Cervicalgia: Principal | ICD-10-CM

## 2015-12-17 NOTE — Progress Notes (Signed)
I called and LM for pt to CB to ask if she needs a referral back to Dr Vertell Limber or if she can just call and make her own appt since former pt there.

## 2015-12-17 NOTE — Telephone Encounter (Signed)
Pt says she just missed your call Pamala Hurry. Please advise at [phone]

## 2015-12-17 NOTE — Telephone Encounter (Signed)
Referral to Dr Francis Dowse for f/u Neck

## 2015-12-17 NOTE — Telephone Encounter (Signed)
See notes about this call in [facility] notes from 4/12. Dr Laney Pastor asked me to call pt to see if she needs a referral back to Dr Vertell Limber. Pt stated that she thinks that she does. She also wants to know what she is supposed to do with the Rx for PT that Dr Laney Pastor wrote. Is she to call somewhere. Advised her that sometimes we will put in a referral for that as well. I spoke to Referrals who agreed that we normally put in a referral order for PT. I advised her I will check with Dr Laney Pastor and if he agrees to both referrals, she should send the PT one to the same ortho where Dr Vertell Limber practices if they do PT. Dr Laney Pastor I have pended both orders. Please advise.

## 2015-12-21 DIAGNOSIS — H00024 Hordeolum internum left upper eyelid: Secondary | ICD-10-CM | POA: Diagnosis not present

## 2015-12-21 NOTE — Telephone Encounter (Signed)
Both referrals have been sent.

## 2015-12-23 ENCOUNTER — Ambulatory Visit: Payer: Medicare Other | Admitting: Internal Medicine

## 2015-12-28 DIAGNOSIS — H00024 Hordeolum internum left upper eyelid: Secondary | ICD-10-CM | POA: Diagnosis not present

## 2016-02-01 DIAGNOSIS — Z6826 Body mass index (BMI) 26.0-26.9, adult: Secondary | ICD-10-CM | POA: Diagnosis not present

## 2016-02-01 DIAGNOSIS — M5416 Radiculopathy, lumbar region: Secondary | ICD-10-CM | POA: Diagnosis not present

## 2016-02-01 DIAGNOSIS — M5412 Radiculopathy, cervical region: Secondary | ICD-10-CM | POA: Diagnosis not present

## 2016-02-01 DIAGNOSIS — M542 Cervicalgia: Secondary | ICD-10-CM | POA: Diagnosis not present

## 2016-02-12 DIAGNOSIS — M5412 Radiculopathy, cervical region: Secondary | ICD-10-CM | POA: Diagnosis not present

## 2016-02-12 DIAGNOSIS — M4322 Fusion of spine, cervical region: Secondary | ICD-10-CM | POA: Diagnosis not present

## 2016-02-12 DIAGNOSIS — M5416 Radiculopathy, lumbar region: Secondary | ICD-10-CM | POA: Diagnosis not present

## 2016-02-12 DIAGNOSIS — M5126 Other intervertebral disc displacement, lumbar region: Secondary | ICD-10-CM | POA: Diagnosis not present

## 2016-02-15 DIAGNOSIS — M542 Cervicalgia: Secondary | ICD-10-CM | POA: Diagnosis not present

## 2016-02-15 DIAGNOSIS — Z6825 Body mass index (BMI) 25.0-25.9, adult: Secondary | ICD-10-CM | POA: Diagnosis not present

## 2016-02-15 DIAGNOSIS — M5412 Radiculopathy, cervical region: Secondary | ICD-10-CM | POA: Diagnosis not present

## 2016-02-15 DIAGNOSIS — M5126 Other intervertebral disc displacement, lumbar region: Secondary | ICD-10-CM | POA: Diagnosis not present

## 2016-02-15 DIAGNOSIS — M5416 Radiculopathy, lumbar region: Secondary | ICD-10-CM | POA: Diagnosis not present

## 2016-02-23 ENCOUNTER — Other Ambulatory Visit: Payer: Self-pay | Admitting: Internal Medicine

## 2016-02-26 NOTE — Telephone Encounter (Signed)
Meds ordered this encounter  Medications  . diazepam (VALIUM) 5 MG tablet    Sig: TAKE 1 TABLET BY MOUTH EVERY 6 HOURS AS NEEDED    Dispense:  120 tablet    Refill:  5   Call in

## 2016-02-26 NOTE — Telephone Encounter (Signed)
Called in.

## 2016-04-19 ENCOUNTER — Encounter: Payer: Self-pay | Admitting: Physician Assistant

## 2016-04-19 ENCOUNTER — Ambulatory Visit (INDEPENDENT_AMBULATORY_CARE_PROVIDER_SITE_OTHER): Payer: Medicare Other | Admitting: Physician Assistant

## 2016-04-19 VITALS — BP 110/68 | HR 90 | Temp 98.1°F | Resp 18 | Ht 70.0 in | Wt 172.0 lb

## 2016-04-19 DIAGNOSIS — G8929 Other chronic pain: Secondary | ICD-10-CM

## 2016-04-19 DIAGNOSIS — R740 Nonspecific elevation of levels of transaminase and lactic acid dehydrogenase [LDH]: Secondary | ICD-10-CM | POA: Diagnosis not present

## 2016-04-19 DIAGNOSIS — R7401 Elevation of levels of liver transaminase levels: Secondary | ICD-10-CM

## 2016-04-19 DIAGNOSIS — M542 Cervicalgia: Secondary | ICD-10-CM | POA: Diagnosis not present

## 2016-04-19 DIAGNOSIS — E785 Hyperlipidemia, unspecified: Secondary | ICD-10-CM

## 2016-04-19 DIAGNOSIS — M545 Low back pain, unspecified: Secondary | ICD-10-CM

## 2016-04-19 DIAGNOSIS — E038 Other specified hypothyroidism: Secondary | ICD-10-CM

## 2016-04-19 DIAGNOSIS — Z114 Encounter for screening for human immunodeficiency virus [HIV]: Secondary | ICD-10-CM | POA: Diagnosis not present

## 2016-04-19 NOTE — Progress Notes (Signed)
Patient ID: [name], female    DOB: 1956/06/22, 60 y.o.   MRN: [id number]  PCP: Leandrew Koyanagi, MD  Subjective:   Chief Complaint  Patient presents with  . Medication Refill    demerol/synthroid  . Other    labs    HPI Presents for evaluation of thyroid function and for medication refills, as well as to establish for primary care, as Dr. Laney Pastor has retired.  She was under the impression that I would be able to continue to provide the demerol, though I do not see documentation of such conversation with Dr. Laney Pastor in the record.   Controlled Substance Overview  Indication for chronic opioid: chronic neck pain, [place] arthritis, CTS, midline low back pain without sciatica Medication and dose: Demerol 100 mg Q6hours prn pain # pills per month: 120 Last UDS date: not yet Pain contract signed (Y/N): not yet Date narcotic database last reviewed (include red flags): not yet   Depression screen [facility] 2/9 04/19/2016 12/09/2015 08/19/2015 04/07/2015 02/18/2015  Decreased Interest 0 0 0 0 3  Down, Depressed, Hopeless 0 0 1 0 3  PHQ - 2 Score 0 0 1 0 6  Altered sleeping - - - - 0  Tired, decreased energy - - - - 3  Change in appetite - - - - 0  Feeling bad or failure about yourself  - - - - 0  Trouble concentrating - - - - 3  Moving slowly or fidgety/restless - - - - 0  Suicidal thoughts - - - - 0  PHQ-9 Score - - - - 12     reports that she does not drink alcohol.   reports that she uses drugs, including Marijuana, about 5 times per week.      Opioid Risk Tool - 04/19/16 1700      Family History of Substance Abuse   Alcohol (P)  Positive Female   Illegal Drugs (P)  Positive Female   Rx Drugs (P)  Negative     Personal History of Substance Abuse   Alcohol (P)  Negative   Illegal Drugs (P)  Negative   Rx Drugs (P)  Negative     Age   Age between 77-45 years  (P)  No     History of Preadolescent Sexual Abuse   History of Preadolescent Sexual Abuse (P)   Negative or Female     Psychological Disease   Psychological Disease (P)  Positive     Total Score   Total Score (P)  5           Review of Systems Chronic pain, multiple sites. No CP, SOB, HA, dizziness.    Patient Active Problem List   Diagnosis Date Noted  . Elevated alanine aminotransferase (ALT) level 04/19/2016  . Midline low back pain without sciatica 12/11/2015  . Hypothyroidism 02/18/2015  . Allergic rhinitis due to pollen 10/26/2011  . Chronic neck pain 10/26/2011  . [place] arthritis 10/26/2011  . CTS (carpal tunnel syndrome) 10/26/2011  . PTSD (post-traumatic stress disorder) 10/26/2011  . Hyperlipemia 10/26/2011  . Hearing loss in right ear 10/26/2011  . Major depressive disorder, recurrent episode ([place]) 10/26/2006  . OTITIS EXTERNA, UNSPECIFIED 10/26/2006     Prior to Admission medications   Medication Sig Start Date End Date Taking? Authorizing Provider  amitriptyline (ELAVIL) 150 MG tablet Take 1 tablet (150 mg total) by mouth at bedtime. 12/09/15  Yes Leandrew Koyanagi, MD  aspirin 81 MG tablet Take 81 mg  by mouth daily.   Yes Historical Provider, MD  atorvastatin (LIPITOR) 80 MG tablet Take 1 tablet (80 mg total) by mouth daily. 09/02/15  Yes Leandrew Koyanagi, MD  Calcium Carbonate (CALCIUM 600 PO) Take 1 capsule by mouth daily.   Yes Historical Provider, MD  clotrimazole-betamethasone (LOTRISONE) cream Apply topically 2 (two) times daily. 02/12/14  Yes Leandrew Koyanagi, MD  cyclobenzaprine (FLEXERIL) 10 MG tablet Take 1 tablet (10 mg total) by mouth 3 (three) times daily as needed. for muscle spams 12/09/15  Yes Leandrew Koyanagi, MD  diazepam (VALIUM) 5 MG tablet TAKE 1 TABLET BY MOUTH EVERY 6 HOURS AS NEEDED 02/26/16  Yes Leandrew Koyanagi, MD  FIBER PO Take 1 tablet by mouth daily.   Yes Historical Provider, MD  levothyroxine (SYNTHROID, LEVOTHROID) 88 MCG tablet Take 1 tablet (88 mcg total) by mouth daily before breakfast. 08/21/15  Yes Leandrew Koyanagi, MD  meloxicam (MOBIC) 15 MG tablet TAKE 1 TABLET BY MOUTH DAILY FOR SHOULDER AND HIP 03/04/15  Yes Thao P Le, DO  meperidine (DEMEROL) 100 MG tablet Take 1 tablet (100 mg total) by mouth every 6 (six) hours as needed for pain. For 02/16/16 12/09/15 06/08/16 Yes Leandrew Koyanagi, MD  mometasone (NASONEX) 50 MCG/ACT nasal spray INSTILL 2 SPRAYS IN [facility] NOSTRIL DAILY 12/09/15  Yes Leandrew Koyanagi, MD  Multiple Vitamin (MULTIVITAMIN) tablet Take 1 tablet by mouth daily.   Yes Historical Provider, MD  NASONEX 50 MCG/ACT nasal spray INSTILL 2 SPRAYS IN EACH NOSTRIL DAILY 09/25/15  Yes Leandrew Koyanagi, MD  tobramycin (TOBREX) 0.3 % ophthalmic ointment Place 1 application into the left eye 3 (three) times daily.   Yes Historical Provider, MD  triamcinolone cream (KENALOG) 0.1 % Apply 1 application topically 2 (two) times daily. 11/16/15  Yes Leandrew Koyanagi, MD  vitamin E 400 UNIT capsule Take 400 Units by mouth daily.   Yes Historical Provider, MD  meperidine (DEMEROL) 100 MG tablet Take 1 tablet (100 mg total) by mouth every 6 (six) hours as needed for pain. For 03/17/16 or after Patient not taking: Reported on 04/19/2016 12/09/15   Leandrew Koyanagi, MD  meperidine (DEMEROL) 100 MG tablet Take 1 tablet (100 mg total) by mouth every 6 (six) hours as needed for pain. For 04/17/16 or after Patient not taking: Reported on 04/19/2016 12/09/15   Leandrew Koyanagi, MD     Allergies  Allergen Reactions  . Codeine Shortness Of Breath, Nausea And Vomiting and Rash  . Penicillins Anaphylaxis  . Morphine And Related   . Prednisone Other (See Comments)    Dizzy  . Sudafed [Pseudoephedrine Hcl] Other (See Comments)    "Hyper"   . Sulfa Antibiotics Rash       Objective:  Physical Exam  Constitutional: She is oriented to person, place, and time. She appears well-developed and well-nourished. She is active and cooperative. No distress.  BP 110/68 (BP Location: Right Arm, Patient Position:  Sitting, Cuff Size: Small)   Pulse 90   Temp 98.1 F (36.7 C) (Oral)   Resp 18   Ht 5\' 10"  (1.778 m)   Wt 172 lb (78 kg)   SpO2 94%   BMI 24.68 kg/m   HENT:  Head: Normocephalic and atraumatic.  Right Ear: Hearing normal.  Left Ear: Hearing normal.  Eyes: Conjunctivae are normal. No scleral icterus.  Neck: Normal range of motion. Neck supple. No thyromegaly present.  Cardiovascular: Normal rate, regular rhythm and normal  heart sounds.   Pulses:      Radial pulses are 2+ on the right side, and 2+ on the left side.  Pulmonary/Chest: Effort normal and breath sounds normal.  Lymphadenopathy:       Head (right side): No tonsillar, no preauricular, no posterior auricular and no occipital adenopathy present.       Head (left side): No tonsillar, no preauricular, no posterior auricular and no occipital adenopathy present.    She has no cervical adenopathy.       Right: No supraclavicular adenopathy present.       Left: No supraclavicular adenopathy present.  Neurological: She is alert and oriented to person, place, and time. No sensory deficit.  Skin: Skin is warm, dry and intact. No rash noted. No cyanosis or erythema. Nails show no clubbing.  Psychiatric: She has a normal mood and affect. Her speech is normal and behavior is normal.           Assessment & Plan:   1. Other specified hypothyroidism - Thyroid Panel With TSH  2. Hyperlipemia - Comprehensive metabolic panel - Lipid panel  3. Elevated alanine aminotransferase (ALT) level - Comprehensive metabolic panel  4. Chronic neck pain 5. Midline low back pain without sciatica Await lab results. Will discuss with [facility] Director and Dr. Brigitte Pulse the possibility of continuing the Demerol from this office.  - Pain Mgmt, Profile 6 Conf w/o mM, U  6. Screening for HIV (human immunodeficiency virus) - HIV antibody   Fara Chute, PA-C Physician Assistant-Certified Ur[facility] [place]

## 2016-04-19 NOTE — Patient Instructions (Addendum)
I will discuss the prescriptions for Demerol with Dr. Reginia Forts, the [facility] Director.  We will make a plan for you getting this medication going forward.    IF you received an x-ray today, you will receive an invoice from [facility]. Please contact [facility] at [phone] with questions or concerns regarding your invoice.   IF you received labwork today, you will receive an invoice from [employer]. Please contact [facility] at [phone] with questions or concerns regarding your invoice.   Our billing staff will not be able to assist you with questions regarding bills from these companies.  You will be contacted with the lab results as soon as they are available. The fastest way to get your results is to activate your My Chart account. Instructions are located on the last page of this paperwork. If you have not heard from [place] regarding the results in 2 weeks, please contact this office.

## 2016-04-20 LAB — COMPREHENSIVE METABOLIC PANEL
ALT: 26 U/L (ref 6–29)
AST: 18 U/L (ref 10–35)
Albumin: 4.6 g/dL (ref 3.6–5.1)
Alkaline Phosphatase: 85 U/L (ref 33–130)
BUN: 8 mg/dL (ref 7–25)
CHLORIDE: 107 mmol/L (ref 98–110)
CO2: 26 mmol/L (ref 20–31)
Calcium: 9.4 mg/dL (ref 8.6–10.4)
Creat: 1.08 mg/dL — ABNORMAL HIGH (ref 0.50–0.99)
Glucose, Bld: 107 mg/dL — ABNORMAL HIGH (ref 65–99)
POTASSIUM: 3.9 mmol/L (ref 3.5–5.3)
SODIUM: 137 mmol/L (ref 135–146)
TOTAL PROTEIN: 7.3 g/dL (ref 6.1–8.1)
Total Bilirubin: 0.3 mg/dL (ref 0.2–1.2)

## 2016-04-20 LAB — LIPID PANEL
CHOL/HDL RATIO: 3.4 ratio (ref <=5.0)
CHOLESTEROL: 162 mg/dL (ref 125–200)
HDL: 48 mg/dL (ref >=46)
LDL CALC: 83 mg/dL (ref <130)
Triglycerides: 157 mg/dL — ABNORMAL HIGH (ref <150)
VLDL: 31 mg/dL — AB (ref <30)

## 2016-04-20 LAB — HIV ANTIBODY (ROUTINE TESTING W REFLEX): HIV 1&2 Ab, 4th Generation: NONREACTIVE

## 2016-04-21 LAB — THYROID PANEL WITH TSH
FREE THYROXINE INDEX: 2.2 (ref 1.4–3.8)
T3 UPTAKE: 29 % (ref 22–35)
T4 TOTAL: 7.7 ug/dL (ref 4.5–12.0)
TSH: 2.42 m[IU]/L

## 2016-05-04 ENCOUNTER — Encounter: Payer: Self-pay | Admitting: Physician Assistant

## 2016-05-17 ENCOUNTER — Encounter: Payer: Self-pay | Admitting: Physician Assistant

## 2016-05-18 ENCOUNTER — Encounter: Payer: Self-pay | Admitting: Physician Assistant

## 2016-05-20 ENCOUNTER — Ambulatory Visit (INDEPENDENT_AMBULATORY_CARE_PROVIDER_SITE_OTHER): Payer: Medicare Other | Admitting: Physician Assistant

## 2016-05-20 VITALS — BP 118/80 | HR 100 | Temp 98.6°F | Resp 16 | Ht 70.0 in | Wt 168.0 lb

## 2016-05-20 DIAGNOSIS — M542 Cervicalgia: Secondary | ICD-10-CM

## 2016-05-20 DIAGNOSIS — F332 Major depressive disorder, recurrent severe without psychotic features: Secondary | ICD-10-CM | POA: Diagnosis not present

## 2016-05-20 DIAGNOSIS — Z79891 Long term (current) use of opiate analgesic: Secondary | ICD-10-CM

## 2016-05-20 DIAGNOSIS — M545 Low back pain, unspecified: Secondary | ICD-10-CM

## 2016-05-20 DIAGNOSIS — G8929 Other chronic pain: Secondary | ICD-10-CM | POA: Diagnosis not present

## 2016-05-20 DIAGNOSIS — F4310 Post-traumatic stress disorder, unspecified: Secondary | ICD-10-CM | POA: Diagnosis not present

## 2016-05-20 DIAGNOSIS — M1990 Unspecified osteoarthritis, unspecified site: Secondary | ICD-10-CM

## 2016-05-20 DIAGNOSIS — Z79899 Other long term (current) drug therapy: Secondary | ICD-10-CM | POA: Diagnosis not present

## 2016-05-20 DIAGNOSIS — E038 Other specified hypothyroidism: Secondary | ICD-10-CM

## 2016-05-20 DIAGNOSIS — M19049 Primary osteoarthritis, unspecified hand: Secondary | ICD-10-CM

## 2016-05-20 HISTORY — DX: Long term (current) use of opiate analgesic: Z79.891

## 2016-05-20 MED ORDER — MEPERIDINE HCL 50 MG PO TABS: 50.0000 mg | ORAL_TABLET | Freq: Four times a day (QID) | ORAL | 0 refills | Status: DC | PRN

## 2016-05-20 MED ORDER — MEPERIDINE HCL 50 MG PO TABS: 75.0000 mg | ORAL_TABLET | Freq: Four times a day (QID) | ORAL | 0 refills | Status: DC | PRN

## 2016-05-20 MED ORDER — MEPERIDINE HCL 50 MG PO TABS: 25.0000 mg | ORAL_TABLET | Freq: Four times a day (QID) | ORAL | 0 refills | Status: DC | PRN

## 2016-05-20 NOTE — Progress Notes (Signed)
Patient ID: [name], female    DOB: 1956/06/06, 60 y.o.   MRN: [id number]  PCP: Leandrew Koyanagi, MD  Subjective:   Chief Complaint  Patient presents with  . Medication Refill    demerol  . Rash    right leg x 1 month    HPI Presents to discuss management of her chronic pain.  She was previously followed by Dr. Laney Pastor, and I met her about a month ago to establish for primary care as Dr. Laney Pastor has retired.  She has been on Demerol QID for many years, and was under the impression that I would take over prescribing. As I do not prescribe that product, I took the concern to the physicians in the practice for review.  Given that the experts have recommended against using Demerol for pain (beyond 48 hours, in the acute setting, in certain situations), it was decided that as a practice, we would not continue to prescribe Demerol for chronic pain management.  I contacted the patient to advise her of that and ask that she come in for discussion of the plan going forward.  She understands the concerns, and is willing to taper off the Demerol, be referred to Pain Management and pursue psychiatric evaluation to seek non-benzodiazepine treatment of her depression/anxiety.    Review of Systems     Patient Active Problem List   Diagnosis Date Noted  . Elevated alanine aminotransferase (ALT) level 04/19/2016  . Midline low back pain without sciatica 12/11/2015  . Hypothyroidism 02/18/2015  . Allergic rhinitis due to pollen 10/26/2011  . Chronic neck pain 10/26/2011  . [place] arthritis 10/26/2011  . CTS (carpal tunnel syndrome) 10/26/2011  . PTSD (post-traumatic stress disorder) 10/26/2011  . Hyperlipemia 10/26/2011  . Hearing loss in right ear 10/26/2011  . Major depressive disorder, recurrent episode ([place]) 10/26/2006  . OTITIS EXTERNA, UNSPECIFIED 10/26/2006        Allergies  Allergen Reactions  . Codeine Shortness Of Breath, Nausea And Vomiting and Rash  .  Penicillins Anaphylaxis  . Gabapentin     Didn't work, and made her feel really bad  . Morphine And Related   . Prednisone Other (See Comments)    Dizzy  . Sudafed [Pseudoephedrine Hcl] Other (See Comments)    "Hyper"   . Sulfa Antibiotics Rash       Objective:  Physical Exam  Constitutional: She is oriented to person, place, and time. She appears well-developed and well-nourished. She is active and cooperative. No distress.  BP 118/80   Pulse 100   Temp 98.6 F (37 C) (Oral)   Resp 16   Ht '5\' 10"'  (1.778 m)   Wt 168 lb (76.2 kg)   SpO2 98%   BMI 24.11 kg/m    Eyes: Conjunctivae are normal.  Pulmonary/Chest: Effort normal.  Neurological: She is alert and oriented to person, place, and time.  Psychiatric: She has a normal mood and affect. Her speech is normal and behavior is normal.           Assessment & Plan:   1. Midline low back pain without sciatica 2. Chronic neck pain Reduce Demerol dose by 25% weekly until D/C. Refer to Pain Management. I will review her paper record for the details of what products she has tried and failed. - meperidine (DEMEROL) 50 MG tablet; Take 1.5 tablets (75 mg total) by mouth every 6 (six) hours as needed for pain.  Dispense: 42 tablet; Refill: 0 - meperidine (DEMEROL) 50 MG  tablet; Take 1 tablet (50 mg total) by mouth every 6 (six) hours as needed for pain.  Dispense: 28 tablet; Refill: 0 - meperidine (DEMEROL) 50 MG tablet; Take 0.5 tablets (25 mg total) by mouth every 6 (six) hours as needed for pain.  Dispense: 14 tablet; Refill: 0 - Ambulatory referral to Pain Clinic  3. Severe episode of recurrent major depressive disorder, without psychotic features ([place]) 4. PTSD (post-traumatic stress disorder) If she is to be on chronic opiates for pain management, she needs to find alternative treatment of her anxiety symptoms. Refer to Psychiatry. I will review her paper record for the details of what products she has tried and failed. -  Ambulatory referral to Psychiatry   Fara Chute, PA-C Physician Assistant-Certified Ur[facility] [place]

## 2016-05-20 NOTE — Telephone Encounter (Signed)
Pt came in to see [name] today

## 2016-05-20 NOTE — Telephone Encounter (Signed)
I see that pt is here for OV today.

## 2016-05-20 NOTE — Patient Instructions (Signed)
     IF you received an x-ray today, you will receive an invoice from [facility]. Please contact [facility] at [phone] with questions or concerns regarding your invoice.   IF you received labwork today, you will receive an invoice from [facility]. Please contact [facility] at [phone] with questions or concerns regarding your invoice.   Our billing staff will not be able to assist you with questions regarding bills from these companies.  You will be contacted with the lab results as soon as they are available. The fastest way to get your results is to activate your My Chart account. Instructions are located on the last page of this paperwork. If you have not heard from us regarding the results in 2 weeks, please contact this office.      

## 2016-08-09 ENCOUNTER — Other Ambulatory Visit: Payer: Self-pay | Admitting: Physician Assistant

## 2016-08-09 ENCOUNTER — Other Ambulatory Visit: Payer: Self-pay

## 2016-08-09 MED ORDER — LEVOTHYROXINE SODIUM 88 MCG PO TABS: 88.0000 ug | ORAL_TABLET | Freq: Every day | ORAL | 0 refills | Status: DC

## 2016-08-11 NOTE — Telephone Encounter (Signed)
Already completed

## 2016-08-11 NOTE — Telephone Encounter (Signed)
Refused. Already completed

## 2016-08-16 ENCOUNTER — Encounter: Payer: Self-pay | Admitting: Physician Assistant

## 2016-08-16 ENCOUNTER — Ambulatory Visit (INDEPENDENT_AMBULATORY_CARE_PROVIDER_SITE_OTHER): Payer: Medicare Other | Admitting: Physician Assistant

## 2016-08-16 VITALS — BP 116/64 | HR 94 | Temp 98.8°F | Resp 16 | Ht 70.0 in | Wt 165.6 lb

## 2016-08-16 DIAGNOSIS — F4310 Post-traumatic stress disorder, unspecified: Secondary | ICD-10-CM | POA: Diagnosis not present

## 2016-08-16 DIAGNOSIS — G8929 Other chronic pain: Secondary | ICD-10-CM

## 2016-08-16 DIAGNOSIS — M542 Cervicalgia: Secondary | ICD-10-CM

## 2016-08-16 DIAGNOSIS — M545 Low back pain: Secondary | ICD-10-CM | POA: Diagnosis not present

## 2016-08-16 DIAGNOSIS — E038 Other specified hypothyroidism: Secondary | ICD-10-CM | POA: Diagnosis not present

## 2016-08-16 DIAGNOSIS — E785 Hyperlipidemia, unspecified: Secondary | ICD-10-CM | POA: Diagnosis not present

## 2016-08-16 DIAGNOSIS — F332 Major depressive disorder, recurrent severe without psychotic features: Secondary | ICD-10-CM

## 2016-08-16 MED ORDER — CYCLOBENZAPRINE HCL 10 MG PO TABS: 10.0000 mg | ORAL_TABLET | Freq: Three times a day (TID) | ORAL | 5 refills | Status: AC | PRN

## 2016-08-16 MED ORDER — DIAZEPAM 5 MG PO TABS: 5.0000 mg | ORAL_TABLET | Freq: Four times a day (QID) | ORAL | 0 refills | Status: DC | PRN

## 2016-08-16 NOTE — Patient Instructions (Addendum)
     IF you received an x-ray today, you will receive an invoice from [facility]. Please contact  Radiology at [phone] with questions or concerns regarding your invoice.   IF you received labwork today, you will receive an invoice from [facility]. Please contact [facility] at [phone] with questions or concerns regarding your invoice.   Our billing staff will not be able to assist you with questions regarding bills from these companies.  You will be contacted with the lab results as soon as they are available. The fastest way to get your results is to activate your My Chart account. Instructions are located on the last page of this paperwork. If you have not heard from us regarding the results in 2 weeks, please contact this office.     

## 2016-08-16 NOTE — Progress Notes (Signed)
Patient ID: [name], female    DOB: 1956/07/18, 60 y.o.   MRN: [id number]  PCP: Harrison Mons, PA-C  Chief Complaint  Patient presents with  . Follow-up    Thyroid and cholesterol check     Subjective:   Presents for evaluation of Hypothyroidism and hyperlipidemia.  Since her last visit with me, her mother died. She'd cared for her mother at home since 2003. Her daughter and granddaughter live with her. No longer taking Demerol, or any other opiate, and reports that it's difficult. Doesn't have reliable transportation. Hasn't established with Pain Management, and doesn't plan to.  She knows they won't see her because of her marijuana use.  Mammogram scheduled with GYN. Cervical cancer screening there, too.  Will need refills of levothyroxine and atorvastatin once we see her lab results today..    Review of Systems  Constitutional: Negative.   HENT: Negative for sore throat.   Eyes: Negative for visual disturbance.  Respiratory: Negative for cough, chest tightness, shortness of breath and wheezing.   Cardiovascular: Negative for chest pain and palpitations.  Gastrointestinal: Negative for abdominal pain, diarrhea, nausea and vomiting.  Genitourinary: Negative for dysuria, frequency, hematuria and urgency.  Musculoskeletal: Positive for back pain and neck pain. Negative for arthralgias.  Skin: Negative for rash.  Neurological: Negative for dizziness, weakness and headaches.  Psychiatric/Behavioral: Negative for decreased concentration. The patient is not nervous/anxious (controlled with treatment).        Patient Active Problem List   Diagnosis Date Noted  . Chronic prescription opiate use 05/20/2016  . Elevated alanine aminotransferase (ALT) level 04/19/2016  . Midline low back pain without sciatica 12/11/2015  . Hypothyroidism 02/18/2015  . Allergic rhinitis due to pollen 10/26/2011  . Chronic neck pain 10/26/2011  . [place] arthritis 10/26/2011  . CTS  (carpal tunnel syndrome) 10/26/2011  . PTSD (post-traumatic stress disorder) 10/26/2011  . Hyperlipemia 10/26/2011  . Hearing loss in right ear 10/26/2011  . Major depressive disorder, recurrent episode ([place]) 10/26/2006     Prior to Admission medications   Medication Sig Start Date End Date Taking? Authorizing Provider  amitriptyline (ELAVIL) 150 MG tablet Take 1 tablet (150 mg total) by mouth at bedtime. 12/09/15  Yes Leandrew Koyanagi, MD  aspirin 81 MG tablet Take 81 mg by mouth daily.   Yes Historical Provider, MD  atorvastatin (LIPITOR) 80 MG tablet Take 1 tablet (80 mg total) by mouth daily. 09/02/15  Yes Leandrew Koyanagi, MD  Calcium Carbonate (CALCIUM 600 PO) Take 1 capsule by mouth daily.   Yes Historical Provider, MD  clotrimazole-betamethasone (LOTRISONE) cream Apply topically 2 (two) times daily. 02/12/14  Yes Leandrew Koyanagi, MD  cyclobenzaprine (FLEXERIL) 10 MG tablet Take 1 tablet (10 mg total) by mouth 3 (three) times daily as needed. for muscle spams 12/09/15  Yes Leandrew Koyanagi, MD  diazepam (VALIUM) 5 MG tablet TAKE 1 TABLET BY MOUTH EVERY 6 HOURS AS NEEDED 02/26/16  Yes Leandrew Koyanagi, MD  FIBER PO Take 1 tablet by mouth daily.   Yes Historical Provider, MD  levothyroxine (SYNTHROID, LEVOTHROID) 88 MCG tablet Take 1 tablet (88 mcg total) by mouth daily before breakfast. 08/09/16  Yes Lannah Koike, PA-C  mometasone (NASONEX) 50 MCG/ACT nasal spray INSTILL 2 SPRAYS IN EACH NOSTRIL DAILY 12/09/15  Yes Leandrew Koyanagi, MD  Multiple Vitamin (MULTIVITAMIN) tablet Take 1 tablet by mouth daily.   Yes Historical Provider, MD  NASONEX 50 MCG/ACT nasal spray INSTILL 2 SPRAYS IN [facility]  NOSTRIL DAILY 09/25/15  Yes Leandrew Koyanagi, MD  tobramycin (TOBREX) 0.3 % ophthalmic ointment Place 1 application into the left eye 3 (three) times daily.   Yes Historical Provider, MD  triamcinolone cream (KENALOG) 0.1 % Apply 1 application topically 2 (two) times daily. 11/16/15  Yes Leandrew Koyanagi, MD  vitamin E 400 UNIT capsule Take 400 Units by mouth daily.   Yes Historical Provider, MD  meloxicam (MOBIC) 15 MG tablet TAKE 1 TABLET BY MOUTH DAILY FOR SHOULDER AND HIP Patient not taking: Reported on 08/16/2016 03/04/15   Thao P Le, DO     Allergies  Allergen Reactions  . Codeine Shortness Of Breath, Nausea And Vomiting and Rash  . Penicillins Anaphylaxis  . Gabapentin     Didn't work, and made her feel really bad  . Morphine And Related   . Pravastatin Other (See Comments)    Chest wall and RUQ abdominal pain 05/2009  . Prednisone Other (See Comments)    Dizzy  . Rosuvastatin Other (See Comments)    11/2010  . Sudafed [Pseudoephedrine Hcl] Other (See Comments)    "Hyper"   . Sulfa Antibiotics Rash       Objective:  Physical Exam  Constitutional: She is oriented to person, place, and time. She appears well-developed and well-nourished. She is active and cooperative. No distress.  BP 116/64   Pulse 94   Temp 98.8 F (37.1 C) (Oral)   Resp 16   Ht 5\' 10"  (1.778 m)   Wt 165 lb 9.6 oz (75.1 kg)   SpO2 96%   BMI 23.76 kg/m   HENT:  Head: Normocephalic and atraumatic.  Right Ear: Hearing normal.  Left Ear: Hearing normal.  Eyes: Conjunctivae are normal. No scleral icterus.  Neck: Normal range of motion. Neck supple. No thyromegaly present.  Cardiovascular: Normal rate, regular rhythm and normal heart sounds.   Pulses:      Radial pulses are 2+ on the right side, and 2+ on the left side.  Pulmonary/Chest: Effort normal and breath sounds normal.  Lymphadenopathy:       Head (right side): No tonsillar, no preauricular, no posterior auricular and no occipital adenopathy present.       Head (left side): No tonsillar, no preauricular, no posterior auricular and no occipital adenopathy present.    She has no cervical adenopathy.       Right: No supraclavicular adenopathy present.       Left: No supraclavicular adenopathy present.  Neurological: She is alert  and oriented to person, place, and time. No sensory deficit.  Skin: Skin is warm, dry and intact. No rash noted. No cyanosis or erythema. Nails show no clubbing.  Psychiatric: She has a normal mood and affect. Her speech is normal and behavior is normal.           Assessment & Plan:   1. Other specified hypothyroidism Await lab results. Adjust treatment as indicated. Will need refill of levothyroxine. - TSH - T4, Free  2. Hyperlipidemia, unspecified hyperlipidemia type Await labs. Adjust regimen as indicated by results. Will need refill of atorvastatin. - Comprehensive metabolic panel - Lipid panel  3. Severe episode of recurrent major depressive disorder, without psychotic features ([place]) Stable. Continue current treatment. - diazepam (VALIUM) 5 MG tablet; Take 1 tablet (5 mg total) by mouth every 6 (six) hours as needed.  Dispense: 120 tablet; Refill: 0  4. Chronic midline low back pain without sciatica 5. Chronic neck pain Stable. No longer  using opiate. Not interested in seeing pain management. - diazepam (VALIUM) 5 MG tablet; Take 1 tablet (5 mg total) by mouth every 6 (six) hours as needed.  Dispense: 120 tablet; Refill: 0 - cyclobenzaprine (FLEXERIL) 10 MG tablet; Take 1 tablet (10 mg total) by mouth 3 (three) times daily as needed. for muscle spams  Dispense: 90 tablet; Refill: 5  6. PTSD (post-traumatic stress disorder) Stable. Continue amitriptyline. - diazepam (VALIUM) 5 MG tablet; Take 1 tablet (5 mg total) by mouth every 6 (six) hours as needed.  Dispense: 120 tablet; Refill: 0   Return in about 6 months (around 02/14/2017) for re-evaluation of thyroid and cholesterol with fasting labs.   Fara Chute, PA-C Physician Assistant-Certified [facility]

## 2016-08-17 LAB — COMPREHENSIVE METABOLIC PANEL
A/G RATIO: 1.8 (ref 1.2–2.2)
ALBUMIN: 4.7 g/dL (ref 3.6–4.8)
ALT: 27 IU/L (ref 0–32)
AST: 24 IU/L (ref 0–40)
Alkaline Phosphatase: 96 IU/L (ref 39–117)
BUN/Creatinine Ratio: 7 — ABNORMAL LOW (ref 12–28)
BUN: 7 mg/dL — ABNORMAL LOW (ref 8–27)
Bilirubin Total: 0.3 mg/dL (ref 0.0–1.2)
CALCIUM: 9.2 mg/dL (ref 8.7–10.3)
CO2: 24 mmol/L (ref 18–29)
Chloride: 101 mmol/L (ref 96–106)
Creatinine, Ser: 0.94 mg/dL (ref 0.57–1.00)
GFR, EST AFRICAN AMERICAN: 75 mL/min/{1.73_m2} (ref >59)
GFR, EST NON AFRICAN AMERICAN: 65 mL/min/{1.73_m2} (ref >59)
GLOBULIN, TOTAL: 2.6 g/dL (ref 1.5–4.5)
Glucose: 97 mg/dL (ref 65–99)
POTASSIUM: 4.4 mmol/L (ref 3.5–5.2)
SODIUM: 141 mmol/L (ref 134–144)
TOTAL PROTEIN: 7.3 g/dL (ref 6.0–8.5)

## 2016-08-17 LAB — LIPID PANEL
CHOL/HDL RATIO: 3.2 ratio (ref 0.0–4.4)
Cholesterol, Total: 164 mg/dL (ref 100–199)
HDL: 51 mg/dL (ref >39)
LDL Calculated: 83 mg/dL (ref 0–99)
Triglycerides: 148 mg/dL (ref 0–149)
VLDL Cholesterol Cal: 30 mg/dL (ref 5–40)

## 2016-08-17 LAB — TSH: TSH: 1.58 u[IU]/mL (ref 0.450–4.500)

## 2016-08-17 LAB — T4, FREE: Free T4: 1.34 ng/dL (ref 0.82–1.77)

## 2016-08-18 ENCOUNTER — Other Ambulatory Visit: Payer: Self-pay

## 2016-08-18 MED ORDER — MOMETASONE FUROATE 50 MCG/ACT NA SUSP: NASAL | 3 refills | Status: AC

## 2016-08-18 NOTE — Telephone Encounter (Signed)
Pharm reqs RFs of nasonex. [name], you just saw pt for check up for chronic issues, but don't see AR discussed recently. OK to RF or RTC?

## 2016-08-18 NOTE — Telephone Encounter (Signed)
Meds ordered this encounter  Medications  . mometasone (NASONEX) 50 MCG/ACT nasal spray    Sig: INSTILL 2 SPRAYS IN EACH NOSTRIL DAILY    Dispense:  51 g    Refill:  3

## 2016-09-23 ENCOUNTER — Other Ambulatory Visit: Payer: Self-pay | Admitting: Physician Assistant

## 2016-09-23 DIAGNOSIS — G8929 Other chronic pain: Secondary | ICD-10-CM

## 2016-09-23 DIAGNOSIS — M542 Cervicalgia: Secondary | ICD-10-CM

## 2016-09-23 DIAGNOSIS — F4310 Post-traumatic stress disorder, unspecified: Secondary | ICD-10-CM

## 2016-09-23 DIAGNOSIS — M545 Low back pain: Secondary | ICD-10-CM

## 2016-09-23 DIAGNOSIS — F332 Major depressive disorder, recurrent severe without psychotic features: Secondary | ICD-10-CM

## 2016-09-23 NOTE — Telephone Encounter (Signed)
Patient notified via My Chart.  Meds ordered this encounter  Medications  . levothyroxine (SYNTHROID, LEVOTHROID) 88 MCG tablet    Sig: TAKE 1 TABLET BY MOUTH DAILY BEFORE BREAKFAST    Dispense:  90 tablet    Refill:  1  . diazepam (VALIUM) 5 MG tablet    Sig: TAKE 1 TABLET BY MOUTH EVERY 6 HOURS AS NEEDED    Dispense:  120 tablet    Refill:  0

## 2016-10-12 ENCOUNTER — Encounter: Payer: Self-pay | Admitting: Physician Assistant

## 2016-10-13 ENCOUNTER — Telehealth: Payer: Self-pay | Admitting: Physician Assistant

## 2016-10-13 NOTE — Telephone Encounter (Signed)
Spoke with the pharmacy in [place], and there are in fact 4 refills remaining. I spoke with the pharmacy here, and they see the prescription and can pull it over to fill it for her here. However, her plan has moved mometasone off their formulary and it is no longer covered. Flonase is the covered product.

## 2016-10-17 MED ORDER — FLUTICASONE PROPIONATE 50 MCG/ACT NA SUSP: 2.0000 | Freq: Every day | NASAL | 12 refills | Status: DC

## 2016-10-17 NOTE — Addendum Note (Signed)
Addended by: Fara Chute on: 10/17/2016 08:10 AM   Modules accepted: Orders

## 2016-10-17 NOTE — Telephone Encounter (Signed)
Meds ordered this encounter  Medications  . fluticasone (FLONASE) 50 MCG/ACT nasal spray    Sig: Place 2 sprays into both nostrils daily.    Dispense:  16 g    Refill:  12    Order Specific Question:   Supervising Provider    Answer:   Shawnee Knapp [phone]    Patient notified via My Chart.

## 2016-10-20 ENCOUNTER — Other Ambulatory Visit: Payer: Self-pay | Admitting: Physician Assistant

## 2016-10-20 DIAGNOSIS — M542 Cervicalgia: Secondary | ICD-10-CM

## 2016-10-20 DIAGNOSIS — F332 Major depressive disorder, recurrent severe without psychotic features: Secondary | ICD-10-CM

## 2016-10-20 DIAGNOSIS — M545 Low back pain: Secondary | ICD-10-CM

## 2016-10-20 DIAGNOSIS — F4310 Post-traumatic stress disorder, unspecified: Secondary | ICD-10-CM

## 2016-10-20 DIAGNOSIS — G8929 Other chronic pain: Secondary | ICD-10-CM

## 2016-10-21 NOTE — Telephone Encounter (Signed)
Called to [facility] 

## 2016-10-21 NOTE — Telephone Encounter (Signed)
Meds ordered this encounter  Medications  . diazepam (VALIUM) 5 MG tablet    Sig: TAKE 1 TABLET BY MOUTH EVERY 6 HOURS AS NEEDED    Dispense:  120 tablet    Refill:  0    

## 2016-10-27 ENCOUNTER — Encounter: Payer: Self-pay | Admitting: Physician Assistant

## 2016-11-22 ENCOUNTER — Other Ambulatory Visit: Payer: Self-pay | Admitting: Physician Assistant

## 2016-11-22 DIAGNOSIS — M542 Cervicalgia: Secondary | ICD-10-CM

## 2016-11-22 DIAGNOSIS — F332 Major depressive disorder, recurrent severe without psychotic features: Secondary | ICD-10-CM

## 2016-11-22 DIAGNOSIS — M545 Low back pain, unspecified: Secondary | ICD-10-CM

## 2016-11-22 DIAGNOSIS — G8929 Other chronic pain: Secondary | ICD-10-CM

## 2016-11-22 DIAGNOSIS — F4310 Post-traumatic stress disorder, unspecified: Secondary | ICD-10-CM

## 2016-11-25 NOTE — Telephone Encounter (Signed)
Faxed to [facility] [place]

## 2016-11-25 NOTE — Telephone Encounter (Signed)
Meds ordered this encounter  Medications  . diazepam (VALIUM) 5 MG tablet    Sig: TAKE 1 TABLET BY MOUTH EVERY 6 HOURS AS NEEDED    Dispense:  120 tablet    Refill:  0    

## 2016-12-05 ENCOUNTER — Other Ambulatory Visit: Payer: Self-pay | Admitting: Obstetrics & Gynecology

## 2016-12-05 ENCOUNTER — Encounter: Payer: Self-pay | Admitting: Physician Assistant

## 2016-12-05 DIAGNOSIS — Z124 Encounter for screening for malignant neoplasm of cervix: Secondary | ICD-10-CM | POA: Diagnosis not present

## 2016-12-05 DIAGNOSIS — Z6824 Body mass index (BMI) 24.0-24.9, adult: Secondary | ICD-10-CM | POA: Diagnosis not present

## 2016-12-05 DIAGNOSIS — Z1231 Encounter for screening mammogram for malignant neoplasm of breast: Secondary | ICD-10-CM | POA: Diagnosis not present

## 2016-12-05 LAB — HM MAMMOGRAPHY

## 2016-12-06 ENCOUNTER — Encounter: Payer: Self-pay | Admitting: Physician Assistant

## 2016-12-06 LAB — CYTOLOGY - PAP

## 2016-12-23 ENCOUNTER — Other Ambulatory Visit: Payer: Self-pay

## 2016-12-23 MED ORDER — ATORVASTATIN CALCIUM 80 MG PO TABS: 80.0000 mg | ORAL_TABLET | Freq: Every day | ORAL | 3 refills | Status: DC

## 2016-12-24 ENCOUNTER — Other Ambulatory Visit: Payer: Self-pay | Admitting: Physician Assistant

## 2016-12-24 DIAGNOSIS — M542 Cervicalgia: Secondary | ICD-10-CM

## 2016-12-24 DIAGNOSIS — M545 Low back pain: Secondary | ICD-10-CM

## 2016-12-24 DIAGNOSIS — G8929 Other chronic pain: Secondary | ICD-10-CM

## 2016-12-24 DIAGNOSIS — F4310 Post-traumatic stress disorder, unspecified: Secondary | ICD-10-CM

## 2016-12-24 DIAGNOSIS — F332 Major depressive disorder, recurrent severe without psychotic features: Secondary | ICD-10-CM

## 2016-12-26 NOTE — Telephone Encounter (Signed)
Meds ordered this encounter  Medications  . diazepam (VALIUM) 5 MG tablet    Sig: TAKE 1 TABLET BY MOUTH EVERY 6 HOURS AS NEEDED    Dispense:  120 tablet    Refill:  0    

## 2016-12-27 NOTE — Telephone Encounter (Signed)
Called to [facility] 

## 2017-01-25 ENCOUNTER — Other Ambulatory Visit: Payer: Self-pay | Admitting: Physician Assistant

## 2017-01-25 DIAGNOSIS — F332 Major depressive disorder, recurrent severe without psychotic features: Secondary | ICD-10-CM

## 2017-01-25 DIAGNOSIS — G8929 Other chronic pain: Secondary | ICD-10-CM

## 2017-01-25 DIAGNOSIS — M545 Low back pain: Secondary | ICD-10-CM

## 2017-01-25 DIAGNOSIS — F4310 Post-traumatic stress disorder, unspecified: Secondary | ICD-10-CM

## 2017-01-25 DIAGNOSIS — M542 Cervicalgia: Secondary | ICD-10-CM

## 2017-01-25 NOTE — Telephone Encounter (Signed)
called to [facility]

## 2017-01-27 ENCOUNTER — Other Ambulatory Visit: Payer: Self-pay | Admitting: Family Medicine

## 2017-01-27 DIAGNOSIS — F332 Major depressive disorder, recurrent severe without psychotic features: Secondary | ICD-10-CM

## 2017-01-27 DIAGNOSIS — M542 Cervicalgia: Secondary | ICD-10-CM

## 2017-01-27 DIAGNOSIS — M545 Low back pain: Secondary | ICD-10-CM

## 2017-01-27 DIAGNOSIS — G8929 Other chronic pain: Secondary | ICD-10-CM

## 2017-01-27 DIAGNOSIS — F4310 Post-traumatic stress disorder, unspecified: Secondary | ICD-10-CM

## 2017-01-27 NOTE — Telephone Encounter (Signed)
[name] pt calling for a RX on her diazepamshe will only have two left until her appointment with you on Tuesday 01/31/17

## 2017-01-30 MED ORDER — DIAZEPAM 5 MG PO TABS: 5.0000 mg | ORAL_TABLET | Freq: Four times a day (QID) | ORAL | 0 refills | Status: DC | PRN

## 2017-01-30 NOTE — Telephone Encounter (Signed)
Called to [facility]

## 2017-01-30 NOTE — Telephone Encounter (Signed)
Meds ordered this encounter  Medications  . diazepam (VALIUM) 5 MG tablet    Sig: Take 1 tablet (5 mg total) by mouth every 6 (six) hours as needed.    Dispense:  120 tablet    Refill:  0

## 2017-01-31 ENCOUNTER — Ambulatory Visit (INDEPENDENT_AMBULATORY_CARE_PROVIDER_SITE_OTHER): Payer: Medicare Other | Admitting: Physician Assistant

## 2017-01-31 ENCOUNTER — Encounter: Payer: Self-pay | Admitting: Physician Assistant

## 2017-01-31 VITALS — BP 128/86 | HR 87 | Temp 98.1°F | Resp 16 | Ht 70.0 in | Wt 169.8 lb

## 2017-01-31 DIAGNOSIS — E038 Other specified hypothyroidism: Secondary | ICD-10-CM

## 2017-01-31 DIAGNOSIS — M545 Low back pain: Secondary | ICD-10-CM

## 2017-01-31 DIAGNOSIS — F332 Major depressive disorder, recurrent severe without psychotic features: Secondary | ICD-10-CM

## 2017-01-31 DIAGNOSIS — F4310 Post-traumatic stress disorder, unspecified: Secondary | ICD-10-CM | POA: Diagnosis not present

## 2017-01-31 DIAGNOSIS — E785 Hyperlipidemia, unspecified: Secondary | ICD-10-CM | POA: Diagnosis not present

## 2017-01-31 DIAGNOSIS — R7401 Elevation of levels of liver transaminase levels: Secondary | ICD-10-CM

## 2017-01-31 DIAGNOSIS — L989 Disorder of the skin and subcutaneous tissue, unspecified: Secondary | ICD-10-CM | POA: Diagnosis not present

## 2017-01-31 DIAGNOSIS — M542 Cervicalgia: Secondary | ICD-10-CM

## 2017-01-31 DIAGNOSIS — R740 Nonspecific elevation of levels of transaminase and lactic acid dehydrogenase [LDH]: Secondary | ICD-10-CM

## 2017-01-31 DIAGNOSIS — L219 Seborrheic dermatitis, unspecified: Secondary | ICD-10-CM

## 2017-01-31 DIAGNOSIS — G8929 Other chronic pain: Secondary | ICD-10-CM | POA: Diagnosis not present

## 2017-01-31 MED ORDER — DIAZEPAM 5 MG PO TABS: 5.0000 mg | ORAL_TABLET | Freq: Four times a day (QID) | ORAL | 0 refills | Status: DC | PRN

## 2017-01-31 MED ORDER — KETOCONAZOLE 2 % EX CREA: 1.0000 "application " | TOPICAL_CREAM | Freq: Every day | CUTANEOUS | 0 refills | Status: AC

## 2017-01-31 MED ORDER — AMITRIPTYLINE HCL 100 MG PO TABS: 100.0000 mg | ORAL_TABLET | Freq: Every day | ORAL | 3 refills | Status: DC

## 2017-01-31 NOTE — Progress Notes (Signed)
Subjective:    Patient ID: [name], female    DOB: 1955/10/22, 61 y.o.   MRN: [id number] PCP: Harrison Mons, PA-C Chief Complaint  Patient presents with  . Medication Refill    HPI: 61 y/o F presents today for a follow up of Graves disease and medication refill. She states that she has been feeling well, with some fatigue which she contributes to her daughter and granddaughter moving in. Complains of recurrent neck pain, RUQ pain, vertigo, dry skin on her face and brown patches on her arm. Wants to decrease amitriptyline dose because she feels that it blocks her emotions and inhibits her ability to dream.   Patient Active Problem List   Diagnosis Date Noted  . Elevated alanine aminotransferase (ALT) level 04/19/2016  . Midline low back pain without sciatica 12/11/2015  . Hypothyroidism 02/18/2015  . Allergic rhinitis due to pollen 10/26/2011  . Chronic neck pain 10/26/2011  . [place] arthritis 10/26/2011  . CTS (carpal tunnel syndrome) 10/26/2011  . PTSD (post-traumatic stress disorder) 10/26/2011  . Hyperlipemia 10/26/2011  . Hearing loss in right ear 10/26/2011  . Major depressive disorder, recurrent episode ([place]) 10/26/2006   Prior to Admission medications   Medication Sig Start Date End Date Taking? Authorizing Provider  amitriptyline (ELAVIL) 100 MG tablet Take 1 tablet (100 mg total) by mouth at bedtime. 01/31/17  Yes Jeffery, [name], PA-C  aspirin 81 MG tablet Take 81 mg by mouth daily.   Yes [provider]  atorvastatin (LIPITOR) 80 MG tablet Take 1 tablet (80 mg total) by mouth daily. 12/23/16  Yes Jeffery, [name], PA-C  Calcium Carbonate (CALCIUM 600 PO) Take 1 capsule by mouth daily.   Yes [provider]  cyclobenzaprine (FLEXERIL) 10 MG tablet Take 1 tablet (10 mg total) by mouth 3 (three) times daily as needed. for muscle spams 08/16/16  Yes Jeffery, [name], PA-C  diazepam (VALIUM) 5 MG tablet Take 1 tablet (5 mg total) by mouth every 6 (six)  hours as needed. 01/31/17  Yes Jeffery, [name], PA-C  FIBER PO Take 1 tablet by mouth daily.   Yes [provider]  fluticasone (FLONASE) 50 MCG/ACT nasal spray Place 2 sprays into both nostrils daily. 10/17/16  Yes Jeffery, [name], PA-C  levothyroxine (SYNTHROID, LEVOTHROID) 88 MCG tablet TAKE 1 TABLET BY MOUTH DAILY BEFORE BREAKFAST 09/23/16  Yes Jeffery, [name], PA-C  mometasone (NASONEX) 50 MCG/ACT nasal spray INSTILL 2 SPRAYS IN EACH NOSTRIL DAILY 08/18/16  Yes Jeffery, [name], PA-C  Multiple Vitamin (MULTIVITAMIN) tablet Take 1 tablet by mouth daily.   Yes [provider]  triamcinolone cream (KENALOG) 0.1 % Apply 1 application topically 2 (two) times daily. 11/16/15  Yes Leandrew Koyanagi, MD  vitamin E 400 UNIT capsule Take 400 Units by mouth daily.   Yes [provider]  clotrimazole-betamethasone (LOTRISONE) cream Apply topically 2 (two) times daily. Patient not taking: Reported on 01/31/2017 02/12/14   Leandrew Koyanagi, MD  ketoconazole (NIZORAL) 2 % cream Apply 1 application topically daily. 01/31/17   Jeffery, [name], PA-C  tobramycin (TOBREX) 0.3 % ophthalmic ointment Place 1 application into the left eye 3 (three) times daily.    [provider]   Allergies  Allergen Reactions  . Codeine Shortness Of Breath, Nausea And Vomiting and Rash  . Penicillins Anaphylaxis  . Gabapentin     Didn't work, and made her feel really bad  . Morphine And Related   . Pravastatin Other (See Comments)    Chest wall and  RUQ abdominal pain 05/2009  . Prednisone Other (See Comments)    Dizzy  . Rosuvastatin Other (See Comments)    11/2010  . Sudafed [Pseudoephedrine Hcl] Other (See Comments)    "Hyper"   . Sulfa Antibiotics Rash      Review of Systems  Constitutional: Negative.   HENT: Negative.   Eyes: Negative.   Respiratory: Negative.   Cardiovascular: Negative.   Gastrointestinal: Positive for abdominal pain.  Endocrine: Negative for cold intolerance,  heat intolerance, polydipsia, polyphagia and polyuria.  Genitourinary: Negative.   Musculoskeletal: Positive for neck pain. Negative for arthralgias, back pain, gait problem, joint swelling, myalgias and neck stiffness.  Skin: Negative.   Allergic/Immunologic: Negative.   Neurological: Positive for dizziness. Negative for tremors, seizures, syncope, facial asymmetry, speech difficulty, weakness, light-headedness, numbness and headaches.  Hematological: Negative.   Psychiatric/Behavioral: Negative.        Objective:   Physical Exam  Constitutional: She is oriented to person, place, and time. She appears well-developed and well-nourished. No distress.  HENT:  Head: Normocephalic and atraumatic.  Eyes: Conjunctivae and EOM are normal. Pupils are equal, round, and reactive to light.  Neck: Normal range of motion. Neck supple. No thyromegaly present.  Cardiovascular: Normal rate, regular rhythm, normal heart sounds and intact distal pulses.   Pulmonary/Chest: Effort normal and breath sounds normal.  Lymphadenopathy:    She has no cervical adenopathy.  Neurological: She is alert and oriented to person, place, and time.  Skin: Skin is warm and dry. She is not diaphoretic.  Brown scaly lesions on left dorsal forearm- 1 cm, left anterior LE- 1 cm, and left anterior thigh- 3 mm  Psychiatric: She has a normal mood and affect. Her behavior is normal. Judgment and thought content normal.   BP 128/86 (BP Location: Right Arm, Patient Position: Sitting, Cuff Size: Large)   Pulse 87   Temp 98.1 F (36.7 C) (Oral)   Resp 16   Ht 5\' 10"  (1.778 m)   Wt 169 lb 12.8 oz (77 kg)   SpO2 95%   BMI 24.36 kg/m   Filed Weights   01/31/17 1121  Weight: 169 lb 12.8 oz (77 kg)          Assessment & Plan:  1. Other specified hypothyroidism - TSH - T4, free  2. Hyperlipidemia, unspecified hyperlipidemia type - Comprehensive metabolic panel - Lipid panel  3. Elevated alanine aminotransferase  (ALT) level - CBC with Differential/Platelet - Comprehensive metabolic panel  4. Seborrhea - ketoconazole (NIZORAL) 2 % cream; Apply 1 application topically daily.  Dispense: 30 g; Refill: 0  5. Skin lesion Well demarcated borders with scales unlikely malignancy.  Monitor for changes in size, color, texture.   6. Severe episode of recurrent major depressive disorder, without psychotic features ([place]) Decrease amitriptyline dose from 150 mg to 100 mg.  - amitriptyline (ELAVIL) 100 MG tablet; Take 1 tablet (100 mg total) by mouth at bedtime.  Dispense: 90 tablet; Refill: 3 - diazepam (VALIUM) 5 MG tablet; Take 1 tablet (5 mg total) by mouth every 6 (six) hours as needed.  Dispense: 120 tablet; Refill: 0  7. PTSD (post-traumatic stress disorder) - diazepam (VALIUM) 5 MG tablet; Take 1 tablet (5 mg total) by mouth every 6 (six) hours as needed.  Dispense: 120 tablet; Refill: 0  8. Chronic midline low back pain without sciatica - diazepam (VALIUM) 5 MG tablet; Take 1 tablet (5 mg total) by mouth every 6 (six) hours as needed.  Dispense: 120 tablet;  Refill: 0  9. Chronic neck pain - diazepam (VALIUM) 5 MG tablet; Take 1 tablet (5 mg total) by mouth every 6 (six) hours as needed.  Dispense: 120 tablet; Refill: 0  Return in about 4 weeks (around 02/28/2017) for re-evaluation of mood following amitriptyline dose reduction.

## 2017-01-31 NOTE — Patient Instructions (Addendum)
     IF you received an x-ray today, you will receive an invoice from [facility]. Please contact [facility] at [phone] with questions or concerns regarding your invoice.   IF you received labwork today, you will receive an invoice from [facility]. Please contact [facility] at [phone] with questions or concerns regarding your invoice.   Our billing staff will not be able to assist you with questions regarding bills from these companies.  You will be contacted with the lab results as soon as they are available. The fastest way to get your results is to activate your My Chart account. Instructions are located on the last page of this paperwork. If you have not heard from us regarding the results in 2 weeks, please contact this office.    We recommend that you schedule a mammogram for breast cancer screening. Typically, you do not need a referral to do this. Please contact a local imaging center to schedule your mammogram.  [facility] - [phone]  *ask for the Radiology Department [facility]) - [phone] or [phone]  [facility] - [phone] [facility] - [phone] [facility] - [phone]  *ask for the Radiology Department [facility] - [phone]  *ask for the Radiology Department [facility] - [phone]  *ask for the Mammography Department [facility] - [phone] 

## 2017-01-31 NOTE — Progress Notes (Signed)
Patient ID: [name], female    DOB: [birth year], 61 y.o.   MRN: [id number]  PCP: Harrison Mons, PA-C  Chief Complaint  Patient presents with  . Medication Refill    Subjective:   Presents for medication refill.  Overall, she is doing well. Continues to ave RUQ pain, which she believes is due to fatty liver infiltration. Due for follow-up labs regarding Graves disease. Also notes some brown patches on her skin-arm, leg, face. Hopes to reduce the amitriptyline dose, feeling that it blunts her emotions, and prevent her from dreaming. However, she is also experiencing the stress of her daughter and granddaughter having moved in with her. This increases her fatigue.  Other chronic issues are stable: neck pain, vertigo.   Review of Systems  Constitutional: Positive for fatigue. Negative for activity change, appetite change, chills, diaphoresis, fever and unexpected weight change.  HENT: Negative for sore throat.   Eyes: Negative.  Negative for visual disturbance.  Respiratory: Negative for cough, choking, chest tightness, shortness of breath and wheezing.   Cardiovascular: Negative for chest pain, palpitations and leg swelling.  Gastrointestinal: Negative for abdominal pain, blood in stool, constipation, diarrhea, nausea and vomiting.  Genitourinary: Negative for dysuria, frequency, hematuria and urgency.  Musculoskeletal: Positive for neck pain. Negative for arthralgias, gait problem, joint swelling and myalgias.  Skin: Negative for rash.       Dry skin patches on the face. Brown, scaled lesions on the arm.  Neurological: Positive for dizziness (vertigo). Negative for weakness and headaches.  Psychiatric/Behavioral: Negative for decreased concentration. The patient is not nervous/anxious.        Patient Active Problem List   Diagnosis Date Noted  . Elevated alanine aminotransferase (ALT) level 04/19/2016  . Midline low back pain without sciatica 12/11/2015  .  Hypothyroidism 02/18/2015  . Allergic rhinitis due to pollen 10/26/2011  . Chronic neck pain 10/26/2011  . [place] arthritis 10/26/2011  . CTS (carpal tunnel syndrome) 10/26/2011  . PTSD (post-traumatic stress disorder) 10/26/2011  . Hyperlipemia 10/26/2011  . Hearing loss in right ear 10/26/2011  . Major depressive disorder, recurrent episode ([place]) 10/26/2006     Prior to Admission medications   Medication Sig Start Date End Date Taking? Authorizing Provider  amitriptyline (ELAVIL) 150 MG tablet Take 1 tablet (150 mg total) by mouth at bedtime. 12/09/15  Yes Leandrew Koyanagi, MD  aspirin 81 MG tablet Take 81 mg by mouth daily.   Yes [provider]  atorvastatin (LIPITOR) 80 MG tablet Take 1 tablet (80 mg total) by mouth daily. 12/23/16  Yes Abbygayle Helfand, PA-C  Calcium Carbonate (CALCIUM 600 PO) Take 1 capsule by mouth daily.   Yes [provider]  cyclobenzaprine (FLEXERIL) 10 MG tablet Take 1 tablet (10 mg total) by mouth 3 (three) times daily as needed. for muscle spams 08/16/16  Yes Artemisia Auvil, PA-C  diazepam (VALIUM) 5 MG tablet Take 1 tablet (5 mg total) by mouth every 6 (six) hours as needed. 01/30/17  Yes Sharmon Cheramie, PA-C  FIBER PO Take 1 tablet by mouth daily.   Yes [provider]  fluticasone (FLONASE) 50 MCG/ACT nasal spray Place 2 sprays into both nostrils daily. 10/17/16  Yes Janeka Libman, PA-C  levothyroxine (SYNTHROID, LEVOTHROID) 88 MCG tablet TAKE 1 TABLET BY MOUTH DAILY BEFORE BREAKFAST 09/23/16  Yes Brielle Moro, PA-C  mometasone (NASONEX) 50 MCG/ACT nasal spray INSTILL 2 SPRAYS IN EACH NOSTRIL DAILY 08/18/16  Yes Manvi Guilliams, PA-C  Multiple Vitamin (MULTIVITAMIN) tablet  Take 1 tablet by mouth daily.   Yes [provider]  triamcinolone cream (KENALOG) 0.1 % Apply 1 application topically 2 (two) times daily. 11/16/15  Yes Leandrew Koyanagi, MD  vitamin E 400 UNIT capsule Take 400 Units by mouth daily.   Yes [provider]  clotrimazole-betamethasone (LOTRISONE) cream Apply topically 2 (two) times daily. Patient not taking: Reported on 01/31/2017 02/12/14   Leandrew Koyanagi, MD  tobramycin (TOBREX) 0.3 % ophthalmic ointment Place 1 application into the left eye 3 (three) times daily.    [provider]     Allergies  Allergen Reactions  . Codeine Shortness Of Breath, Nausea And Vomiting and Rash  . Penicillins Anaphylaxis  . Gabapentin     Didn't work, and made her feel really bad  . Morphine And Related   . Pravastatin Other (See Comments)    Chest wall and RUQ abdominal pain 05/2009  . Prednisone Other (See Comments)    Dizzy  . Rosuvastatin Other (See Comments)    11/2010  . Sudafed [Pseudoephedrine Hcl] Other (See Comments)    "Hyper"   . Sulfa Antibiotics Rash       Objective:  Physical Exam  Constitutional: She is oriented to person, place, and time. She appears well-developed and well-nourished. She is active and cooperative. No distress.  BP 128/86 (BP Location: Right Arm, Patient Position: Sitting, Cuff Size: Large)   Pulse 87   Temp 98.1 F (36.7 C) (Oral)   Resp 16   Ht 5\' 10"  (1.778 m)   Wt 169 lb 12.8 oz (77 kg)   SpO2 95%   BMI 24.36 kg/m   HENT:  Head: Normocephalic and atraumatic.  Right Ear: Hearing normal.  Left Ear: Hearing normal.  Eyes: Conjunctivae are normal. No scleral icterus.  Neck: Normal range of motion. Neck supple. No thyromegaly present.  Cardiovascular: Normal rate, regular rhythm and normal heart sounds.   Pulses:      Radial pulses are 2+ on the right side, and 2+ on the left side.  Pulmonary/Chest: Effort normal and breath sounds normal.  Lymphadenopathy:       Head (right side): No tonsillar, no preauricular, no posterior auricular and no occipital adenopathy present.       Head (left side): No tonsillar, no preauricular, no posterior auricular and no occipital adenopathy present.    She has no cervical adenopathy.        Right: No supraclavicular adenopathy present.       Left: No supraclavicular adenopathy present.  Neurological: She is alert and oriented to person, place, and time. No sensory deficit.  Skin: Skin is warm, dry and intact. Lesion (scattered hyperkeratotic, hyperpigmented, "stuck-on" appearing plaques) and rash noted. Rash is macular (forehead and nasal folds, consistent with seborrhea). No cyanosis or erythema. Nails show no clubbing.  Psychiatric: She has a normal mood and affect. Her speech is normal and behavior is normal.    Wt Readings from Last 3 Encounters:  01/31/17 169 lb 12.8 oz (77 kg)  08/16/16 165 lb 9.6 oz (75.1 kg)  05/20/16 168 lb (76.2 kg)       Assessment & Plan:   Problem List Items Addressed This Visit    Major depressive disorder, recurrent episode ([place])    Stable. Reduce amitriptyline from 150 mg to 100 mg and reassess.      Relevant Medications   amitriptyline (ELAVIL) 100 MG tablet   diazepam (VALIUM) 5 MG tablet   Chronic neck pain  Stable. No longer on opiates.      Relevant Medications   amitriptyline (ELAVIL) 100 MG tablet   diazepam (VALIUM) 5 MG tablet   PTSD (post-traumatic stress disorder)    Stable.       Relevant Medications   diazepam (VALIUM) 5 MG tablet   Hyperlipemia    Await labs. Adjust regimen as indicated by results.       Relevant Orders   Comprehensive metabolic panel (Completed)   Lipid panel (Completed)   Hypothyroidism - Primary    Await labs. Adjust regimen as indicated by results.       Relevant Orders   TSH (Completed)   T4, free (Completed)   Midline low back pain without sciatica    No longer on opiates. Did not schedule with pain management.      Relevant Medications   diazepam (VALIUM) 5 MG tablet   Elevated alanine aminotransferase (ALT) level    Update labs today. If normalized. Would not repeat imaging. If not normalized, would update RUQ [place], which showed fatty liver infiltration in 2015 (CT in 2016  did not identify cause of her symptoms).      Relevant Orders   CBC with Differential/Platelet (Completed)   Comprehensive metabolic panel (Completed)    Other Visit Diagnoses    Seborrhea       Relevant Medications   ketoconazole (NIZORAL) 2 % cream   Skin lesion           Return in about 4 weeks (around 02/28/2017) for re-evaluation of mood following amitriptyline dose reduction.   Fara Chute, PA-C [facility]

## 2017-02-01 LAB — CBC WITH DIFFERENTIAL/PLATELET
BASOS ABS: 0 10*3/uL (ref 0.0–0.2)
Basos: 1 %
EOS (ABSOLUTE): 0.1 10*3/uL (ref 0.0–0.4)
Eos: 2 %
HEMATOCRIT: 41.3 % (ref 34.0–46.6)
Hemoglobin: 13.9 g/dL (ref 11.1–15.9)
Immature Grans (Abs): 0 10*3/uL (ref 0.0–0.1)
Immature Granulocytes: 0 %
LYMPHS ABS: 2.6 10*3/uL (ref 0.7–3.1)
Lymphs: 42 %
MCH: 29.9 pg (ref 26.6–33.0)
MCHC: 33.7 g/dL (ref 31.5–35.7)
MCV: 89 fL (ref 79–97)
MONOS ABS: 0.5 10*3/uL (ref 0.1–0.9)
Monocytes: 7 %
NEUTROS ABS: 2.9 10*3/uL (ref 1.4–7.0)
Neutrophils: 48 %
Platelets: 299 10*3/uL (ref 150–379)
RBC: 4.65 x10E6/uL (ref 3.77–5.28)
RDW: 13.9 % (ref 12.3–15.4)
WBC: 6.2 10*3/uL (ref 3.4–10.8)

## 2017-02-01 LAB — LIPID PANEL
CHOLESTEROL TOTAL: 163 mg/dL (ref 100–199)
Chol/HDL Ratio: 3.1 ratio (ref 0.0–4.4)
HDL: 53 mg/dL (ref >39)
LDL Calculated: 89 mg/dL (ref 0–99)
Triglycerides: 103 mg/dL (ref 0–149)
VLDL CHOLESTEROL CAL: 21 mg/dL (ref 5–40)

## 2017-02-01 LAB — COMPREHENSIVE METABOLIC PANEL
ALK PHOS: 100 IU/L (ref 39–117)
ALT: 28 IU/L (ref 0–32)
AST: 26 IU/L (ref 0–40)
Albumin/Globulin Ratio: 1.7 (ref 1.2–2.2)
Albumin: 4.8 g/dL (ref 3.6–4.8)
BILIRUBIN TOTAL: 0.6 mg/dL (ref 0.0–1.2)
BUN / CREAT RATIO: 9 — AB (ref 12–28)
BUN: 9 mg/dL (ref 8–27)
CO2: 25 mmol/L (ref 18–29)
CREATININE: 0.96 mg/dL (ref 0.57–1.00)
Calcium: 9.9 mg/dL (ref 8.7–10.3)
Chloride: 99 mmol/L (ref 96–106)
GFR calc Af Amer: 73 mL/min/{1.73_m2} (ref >59)
GFR calc non Af Amer: 63 mL/min/{1.73_m2} (ref >59)
GLOBULIN, TOTAL: 2.8 g/dL (ref 1.5–4.5)
GLUCOSE: 95 mg/dL (ref 65–99)
Potassium: 4.4 mmol/L (ref 3.5–5.2)
SODIUM: 140 mmol/L (ref 134–144)
Total Protein: 7.6 g/dL (ref 6.0–8.5)

## 2017-02-01 LAB — TSH: TSH: 0.834 u[IU]/mL (ref 0.450–4.500)

## 2017-02-01 LAB — T4, FREE: Free T4: 1.52 ng/dL (ref 0.82–1.77)

## 2017-02-01 NOTE — Assessment & Plan Note (Signed)
Stable. Reduce amitriptyline from 150 mg to 100 mg and reassess.

## 2017-02-01 NOTE — Assessment & Plan Note (Signed)
Stable. No longer on opiates.

## 2017-02-01 NOTE — Assessment & Plan Note (Signed)
Await labs. Adjust regimen as indicated by results.  

## 2017-02-01 NOTE — Assessment & Plan Note (Signed)
Update labs today. If normalized. Would not repeat imaging. If not normalized, would update RUQ [place], which showed fatty liver infiltration in 2015 (CT in 2016 did not identify cause of her symptoms).

## 2017-02-01 NOTE — Assessment & Plan Note (Signed)
Stable

## 2017-02-01 NOTE — Assessment & Plan Note (Signed)
No longer on opiates. Did not schedule with pain management.

## 2017-02-02 ENCOUNTER — Encounter: Payer: Self-pay | Admitting: Physician Assistant

## 2017-02-28 ENCOUNTER — Encounter: Payer: Self-pay | Admitting: Physician Assistant

## 2017-02-28 ENCOUNTER — Ambulatory Visit (INDEPENDENT_AMBULATORY_CARE_PROVIDER_SITE_OTHER): Payer: Medicare Other | Admitting: Physician Assistant

## 2017-02-28 VITALS — BP 120/81 | HR 90 | Temp 98.1°F | Resp 18 | Ht 70.0 in | Wt 169.8 lb

## 2017-02-28 DIAGNOSIS — M542 Cervicalgia: Secondary | ICD-10-CM

## 2017-02-28 DIAGNOSIS — F4310 Post-traumatic stress disorder, unspecified: Secondary | ICD-10-CM

## 2017-02-28 DIAGNOSIS — G8929 Other chronic pain: Secondary | ICD-10-CM | POA: Diagnosis not present

## 2017-02-28 DIAGNOSIS — F331 Major depressive disorder, recurrent, moderate: Secondary | ICD-10-CM | POA: Diagnosis not present

## 2017-02-28 DIAGNOSIS — M545 Low back pain: Secondary | ICD-10-CM

## 2017-02-28 DIAGNOSIS — F332 Major depressive disorder, recurrent severe without psychotic features: Secondary | ICD-10-CM

## 2017-02-28 MED ORDER — ONDANSETRON HCL 4 MG PO TABS: 4.0000 mg | ORAL_TABLET | Freq: Three times a day (TID) | ORAL | 0 refills | Status: AC | PRN

## 2017-02-28 MED ORDER — DIAZEPAM 5 MG PO TABS: 5.0000 mg | ORAL_TABLET | Freq: Four times a day (QID) | ORAL | 0 refills | Status: DC | PRN

## 2017-02-28 MED ORDER — DULOXETINE HCL 20 MG PO CPEP: 20.0000 mg | ORAL_CAPSULE | Freq: Two times a day (BID) | ORAL | 3 refills | Status: DC

## 2017-02-28 NOTE — Assessment & Plan Note (Signed)
Change from amitriptyline to Cymbalta. Anticipatory guidance provided. Ondansetron for nausea as needed.

## 2017-02-28 NOTE — Assessment & Plan Note (Signed)
Trial of Cymbalta

## 2017-02-28 NOTE — Patient Instructions (Addendum)
STOP the amitriptyline. START the duloxetine (Cymbalta). USE the ondansetron (Zofran) as needed for the nausea that may come with starting the nausea/vomiting. You can expect any nausea/vomiting to resolve in 2 weeks.    IF you received an x-ray today, you will receive an invoice from [facility]. Please contact [facility] at [phone] with questions or concerns regarding your invoice.   IF you received labwork today, you will receive an invoice from [place]. Please contact [facility] at [phone] with questions or concerns regarding your invoice.   Our billing staff will not be able to assist you with questions regarding bills from these companies.  You will be contacted with the lab results as soon as they are available. The fastest way to get your results is to activate your My Chart account. Instructions are located on the last page of this paperwork. If you have not heard from [place] regarding the results in 2 weeks, please contact this office.

## 2017-02-28 NOTE — Assessment & Plan Note (Signed)
Try change to Cymbalta.

## 2017-02-28 NOTE — Progress Notes (Signed)
Patient ID: [name], female    DOB: 1955/11/13, 61 y.o.   MRN: [id number]  PCP: Harrison Mons, PA-C  Chief Complaint  Patient presents with  . Mood    per pt states she doesn't feel much different from her last visit. Depression scale score 8.    Subjective:   Presents for evaluation of mood.  At her visit last month, she reported the amitriptyline was helping some, but was making her feel blunted, like she couldn't cry. We tried lowering the dose to see if that would help.  On the lower dose, she feels more able to express her emotions, but also much more depressed.  Depression screen [facility] 2/9 02/28/2017 01/31/2017 08/16/2016 04/19/2016 12/09/2015  Decreased Interest 0 1 0 0 0  Down, Depressed, Hopeless 3 1 0 0 0  PHQ - 2 Score 3 2 0 0 0  Altered sleeping 0 1 - - -  Tired, decreased energy 3 1 - - -  Change in appetite 1 1 - - -  Feeling bad or failure about yourself  1 1 - - -  Trouble concentrating 0 1 - - -  Moving slowly or fidgety/restless 0 1 - - -  Suicidal thoughts - 1 - - -  PHQ-9 Score 8 9 - - -  Difficult doing work/chores Extremely dIfficult Somewhat difficult - - -     Review of Systems As above. Chronic neck pain is unchanged.   Patient Active Problem List   Diagnosis Date Noted  . Midline low back pain without sciatica 12/11/2015  . Hypothyroidism 02/18/2015  . Allergic rhinitis due to pollen 10/26/2011  . Chronic neck pain 10/26/2011  . [place] arthritis 10/26/2011  . CTS (carpal tunnel syndrome) 10/26/2011  . PTSD (post-traumatic stress disorder) 10/26/2011  . Hyperlipemia 10/26/2011  . Hearing loss in right ear 10/26/2011  . Major depressive disorder, recurrent episode ([place]) 10/26/2006     Prior to Admission medications   Medication Sig Start Date End Date Taking? Authorizing Provider  amitriptyline (ELAVIL) 100 MG tablet Take 1 tablet (100 mg total) by mouth at bedtime. 01/31/17  Yes Joscelyne Renville, PA-C  aspirin 81 MG tablet Take 81  mg by mouth daily.   Yes [provider]  atorvastatin (LIPITOR) 80 MG tablet Take 1 tablet (80 mg total) by mouth daily. 12/23/16  Yes Lelia Jons, PA-C  Calcium Carbonate (CALCIUM 600 PO) Take 1 capsule by mouth daily.   Yes [provider]  cyclobenzaprine (FLEXERIL) 10 MG tablet Take 1 tablet (10 mg total) by mouth 3 (three) times daily as needed. for muscle spams 08/16/16  Yes Bryceson Grape, PA-C  diazepam (VALIUM) 5 MG tablet Take 1 tablet (5 mg total) by mouth every 6 (six) hours as needed. 01/31/17  Yes Keylah Darwish, PA-C  FIBER PO Take 1 tablet by mouth daily.   Yes [provider]  fluticasone (FLONASE) 50 MCG/ACT nasal spray Place 2 sprays into both nostrils daily. 10/17/16  Yes Posie Lillibridge, PA-C  ketoconazole (NIZORAL) 2 % cream Apply 1 application topically daily. 01/31/17  Yes Bernise Sylvain, PA-C  levothyroxine (SYNTHROID, LEVOTHROID) 88 MCG tablet TAKE 1 TABLET BY MOUTH DAILY BEFORE BREAKFAST 09/23/16  Yes Ahlivia Salahuddin, PA-C  Multiple Vitamin (MULTIVITAMIN) tablet Take 1 tablet by mouth daily.   Yes [provider]  vitamin E 400 UNIT capsule Take 400 Units by mouth daily.   Yes [provider]  mometasone (NASONEX) 50 MCG/ACT nasal spray INSTILL 2 SPRAYS  IN [facility] NOSTRIL DAILY Patient not taking: Reported on 02/28/2017 08/18/16   Harrison Mons, PA-C  tobramycin (TOBREX) 0.3 % ophthalmic ointment Place 1 application into the left eye 3 (three) times daily.    [provider]     Allergies  Allergen Reactions  . Codeine Shortness Of Breath, Nausea And Vomiting and Rash  . Penicillins Anaphylaxis  . Gabapentin     Didn't work, and made her feel really bad  . Morphine And Related   . Pravastatin Other (See Comments)    Chest wall and RUQ abdominal pain 05/2009  . Prednisone Other (See Comments)    Dizzy  . Rosuvastatin Other (See Comments)    11/2010  . Sudafed [Pseudoephedrine Hcl] Other (See Comments)     "Hyper"   . Sulfa Antibiotics Rash       Objective:  Physical Exam  Constitutional: She is oriented to person, place, and time. She appears well-developed and well-nourished. She is active and cooperative. No distress.  BP 120/81 (BP Location: Right Arm, Patient Position: Sitting, Cuff Size: Normal)   Pulse 90   Temp 98.1 F (36.7 C) (Oral)   Resp 18   Ht 5\' 10"  (1.778 m)   Wt 169 lb 12.8 oz (77 kg)   SpO2 96%   BMI 24.36 kg/m   HENT:  Head: Normocephalic and atraumatic.  Right Ear: Hearing normal.  Left Ear: Hearing normal.  Eyes: Conjunctivae are normal. No scleral icterus.  Neck: Normal range of motion. Neck supple. No thyromegaly present.  Cardiovascular: Normal rate, regular rhythm and normal heart sounds.   Pulses:      Radial pulses are 2+ on the right side, and 2+ on the left side.  Pulmonary/Chest: Effort normal and breath sounds normal.  Lymphadenopathy:       Head (right side): No tonsillar, no preauricular, no posterior auricular and no occipital adenopathy present.       Head (left side): No tonsillar, no preauricular, no posterior auricular and no occipital adenopathy present.    She has no cervical adenopathy.       Right: No supraclavicular adenopathy present.       Left: No supraclavicular adenopathy present.  Neurological: She is alert and oriented to person, place, and time. No sensory deficit.  Skin: Skin is warm, dry and intact. No rash noted. No cyanosis or erythema. Nails show no clubbing.  Psychiatric: Her speech is normal and behavior is normal. Judgment and thought content normal. Her mood appears not anxious. Her affect is blunt and labile. Her affect is not angry and not inappropriate. Cognition and memory are normal. She exhibits a depressed mood.           Assessment & Plan:   Problem List Items Addressed This Visit    Major depressive disorder, recurrent episode ([place])    Change from amitriptyline to Cymbalta. Anticipatory guidance  provided. Ondansetron for nausea as needed.      Relevant Medications   DULoxetine (CYMBALTA) 20 MG capsule   ondansetron (ZOFRAN) 4 MG tablet   diazepam (VALIUM) 5 MG tablet   Chronic neck pain    Try change to Cymbalta.      Relevant Medications   DULoxetine (CYMBALTA) 20 MG capsule   diazepam (VALIUM) 5 MG tablet   PTSD (post-traumatic stress disorder)   Relevant Medications   diazepam (VALIUM) 5 MG tablet   Midline low back pain without sciatica    Trial of Cymbalta.      Relevant Medications  diazepam (VALIUM) 5 MG tablet    Other Visit Diagnoses    Major depressive disorder, recurrent episode, moderate ([facility])   (Chronic)  -  Primary   Relevant Medications   DULoxetine (CYMBALTA) 20 MG capsule   ondansetron (ZOFRAN) 4 MG tablet   diazepam (VALIUM) 5 MG tablet       Return in about 4 weeks (around 03/28/2017) for mood after starting duloxetine (Cymbalta).   Fara Chute, PA-C [facility] [place]

## 2017-02-28 NOTE — Progress Notes (Signed)
Subjective:    Patient ID: [name], female    DOB: Jul 25, 1956, 61 y.o.   MRN: [id number] PCP: Harrison Mons, PA-C Chief Complaint  Patient presents with  . Mood    per pt states she doesn't feel much different from her last visit. Depression scale score 8.    HPI: 61 y/o F presents today for follow up after decreasing amitriptyline dose 63 month ago. The amitriptyline dose was decreased because patient felt that her emotions were blunted. She could not cry when she wanted to. Since decreasing the dose she has no energy. Sleeping okay. More sad than before. Still feels upset about her mother. Feels hopeless. Still living with daughter and granddaughter which she thinks contributes to her stress.   Noticed she's in more pain in her back than she was. Appetite comes and goes.  No exercise other than mowing grass.  Was originally put on amitriptyline because of the pain relief that it can provide.   Patient Active Problem List   Diagnosis Date Noted  . Elevated alanine aminotransferase (ALT) level 04/19/2016  . Midline low back pain without sciatica 12/11/2015  . Hypothyroidism 02/18/2015  . Allergic rhinitis due to pollen 10/26/2011  . Chronic neck pain 10/26/2011  . [place] arthritis 10/26/2011  . CTS (carpal tunnel syndrome) 10/26/2011  . PTSD (post-traumatic stress disorder) 10/26/2011  . Hyperlipemia 10/26/2011  . Hearing loss in right ear 10/26/2011  . Major depressive disorder, recurrent episode ([place]) 10/26/2006   Past Medical History:  Diagnosis Date  . Allergic rhinitis   . Allergy   . Anemia   . Anxiety   . Carpal tunnel syndrome   . Chronic prescription opiate use 05/20/2016   Due to DDD if the neck and lumbar spine  . Colon polyp    adenomatous  . Depression   . Gallstones   . Graves disease    thyroid disease  . Heart murmur    as a child  . Hepatic steatosis   . Hyperlipidemia   . Inguinal hernia   . Osteoarthritis   . Osteoporosis   . Pneumonia     . PTSD (post-traumatic stress disorder)   . Toxic diffuse goiter   . Vertigo    Prior to Admission medications   Medication Sig Start Date End Date Taking? Authorizing Provider  amitriptyline (ELAVIL) 100 MG tablet Take 1 tablet (100 mg total) by mouth at bedtime. 01/31/17   Harrison Mons, PA-C  aspirin 81 MG tablet Take 81 mg by mouth daily.    [provider]  atorvastatin (LIPITOR) 80 MG tablet Take 1 tablet (80 mg total) by mouth daily. 12/23/16   Harrison Mons, PA-C  Calcium Carbonate (CALCIUM 600 PO) Take 1 capsule by mouth daily.    [provider]  cyclobenzaprine (FLEXERIL) 10 MG tablet Take 1 tablet (10 mg total) by mouth 3 (three) times daily as needed. for muscle spams 08/16/16   Jeffery, [name], PA-C  diazepam (VALIUM) 5 MG tablet Take 1 tablet (5 mg total) by mouth every 6 (six) hours as needed. 01/31/17   Harrison Mons, PA-C  FIBER PO Take 1 tablet by mouth daily.    [provider]  fluticasone (FLONASE) 50 MCG/ACT nasal spray Place 2 sprays into both nostrils daily. 10/17/16   Harrison Mons, PA-C  ketoconazole (NIZORAL) 2 % cream Apply 1 application topically daily. 01/31/17   Harrison Mons, PA-C  levothyroxine (SYNTHROID, LEVOTHROID) 88 MCG tablet TAKE 1 TABLET BY MOUTH DAILY BEFORE BREAKFAST 09/23/16  Jeffery, [name], PA-C  mometasone (NASONEX) 50 MCG/ACT nasal spray INSTILL 2 SPRAYS IN EACH NOSTRIL DAILY 08/18/16   Harrison Mons, PA-C  Multiple Vitamin (MULTIVITAMIN) tablet Take 1 tablet by mouth daily.    [provider]  tobramycin (TOBREX) 0.3 % ophthalmic ointment Place 1 application into the left eye 3 (three) times daily.    [provider]  vitamin E 400 UNIT capsule Take 400 Units by mouth daily.    [provider]   Allergies  Allergen Reactions  . Codeine Shortness Of Breath, Nausea And Vomiting and Rash  . Penicillins Anaphylaxis  . Gabapentin     Didn't work, and made her feel really bad  . Morphine  And Related   . Pravastatin Other (See Comments)    Chest wall and RUQ abdominal pain 05/2009  . Prednisone Other (See Comments)    Dizzy  . Rosuvastatin Other (See Comments)    11/2010  . Sudafed [Pseudoephedrine Hcl] Other (See Comments)    "Hyper"   . Sulfa Antibiotics Rash    Review of Systems  Constitutional: Positive for fatigue.  HENT: Negative.   Eyes: Negative.   Respiratory: Negative.   Cardiovascular: Negative.   Gastrointestinal: Negative.  Negative for abdominal pain, blood in stool, constipation, diarrhea, nausea and vomiting.  Endocrine: Negative.  Negative for cold intolerance.  Genitourinary: Negative for dysuria, frequency, hematuria and urgency.  Musculoskeletal: Positive for back pain and neck pain.  Allergic/Immunologic: Negative.   Neurological: Negative.  Negative for dizziness and light-headedness.  Hematological: Negative.   Psychiatric/Behavioral: Negative.  Negative for self-injury, sleep disturbance and suicidal ideas.       Objective:   Physical Exam  Constitutional: She is oriented to person, place, and time. She appears well-developed and well-nourished. No distress.  BP 120/81 (BP Location: Right Arm, Patient Position: Sitting, Cuff Size: Normal)   Pulse 90   Temp 98.1 F (36.7 C) (Oral)   Resp 18   Ht 5\' 10"  (1.778 m)   Wt 169 lb 12.8 oz (77 kg)   SpO2 96%   BMI 24.36 kg/m    HENT:  Head: Normocephalic and atraumatic.  Eyes: Conjunctivae and EOM are normal. Pupils are equal, round, and reactive to light.  Neck: Normal range of motion. Neck supple. No thyromegaly present.  Cardiovascular: Normal rate, regular rhythm, normal heart sounds and intact distal pulses.   Pulmonary/Chest: Effort normal and breath sounds normal.  Lymphadenopathy:    She has no cervical adenopathy.  Neurological: She is alert and oriented to person, place, and time.  Skin: Skin is warm and dry. She is not diaphoretic.  Psychiatric: She has a normal mood and  affect. Her behavior is normal. Judgment and thought content normal.    Depression screen [facility] 2/9 02/28/2017 01/31/2017 08/16/2016 04/19/2016 12/09/2015  Decreased Interest 0 1 0 0 0  Down, Depressed, Hopeless 3 1 0 0 0  PHQ - 2 Score 3 2 0 0 0  Altered sleeping 0 1 - - -  Tired, decreased energy 3 1 - - -  Change in appetite 1 1 - - -  Feeling bad or failure about yourself  1 1 - - -  Trouble concentrating 0 1 - - -  Moving slowly or fidgety/restless 0 1 - - -  Suicidal thoughts - 1 - - -  PHQ-9 Score 8 9 - - -  Difficult doing work/chores Extremely dIfficult Somewhat difficult - - -         Assessment &  Plan:  1. Major depressive disorder, recurrent episode, moderate (HCC) Stop taking Amitriptyline. Before decreasing dose depression did not seem to be controlled well. Start taking Cymbalta twice a day. Zofran prescribed to help with nauseau and vomiting that may occur in up to 50% of patients in the first 2 weeks of taking Cymbalta. Originally prescribed Amitriptyline because it can help manage pain, and we thought Cymbalta would be a good replacement due to its ability to help with nerve pain.  - DULoxetine (CYMBALTA) 20 MG capsule; Take 1 capsule (20 mg total) by mouth 2 (two) times daily.  Dispense: 60 capsule; Refill: 3 - ondansetron (ZOFRAN) 4 MG tablet; Take 1 tablet (4 mg total) by mouth every 8 (eight) hours as needed for nausea or vomiting.  Dispense: 30 tablet; Refill: 0  2. Chronic neck pain - diazepam (VALIUM) 5 MG tablet; Take 1 tablet (5 mg total) by mouth every 6 (six) hours as needed.  Dispense: 120 tablet; Refill: 0 3. Chronic midline low back pain without sciatica - diazepam (VALIUM) 5 MG tablet; Take 1 tablet (5 mg total) by mouth every 6 (six) hours as needed.  Dispense: 120 tablet; Refill: 0 Patient continues to have chronic pain. Choosing Cymbalta as it may help alleviate some of that pain.  Take diazepam as needed.   4. Severe episode of recurrent major depressive  disorder, without psychotic features ([place]) - diazepam (VALIUM) 5 MG tablet; Take 1 tablet (5 mg total) by mouth every 6 (six) hours as needed.  Dispense: 120 tablet; Refill: 0 5. PTSD (post-traumatic stress disorder) - diazepam (VALIUM) 5 MG tablet; Take 1 tablet (5 mg total) by mouth every 6 (six) hours as needed.  Dispense: 120 tablet; Refill: 0 Depression not well controlled at this time. Continue taking diazepam as prescribed and start taking Cymbalta.  Return in about 4 weeks (around 03/28/2017) for mood after starting duloxetine (Cymbalta).

## 2017-03-20 ENCOUNTER — Telehealth: Payer: Self-pay | Admitting: Family Medicine

## 2017-03-20 DIAGNOSIS — F332 Major depressive disorder, recurrent severe without psychotic features: Secondary | ICD-10-CM

## 2017-03-20 NOTE — Telephone Encounter (Signed)
Pt calling to let [name] know that she stop taking the Cymbalta a week after she started the medicine she was having headache and was nauseated and still feeling that way please respond

## 2017-03-21 NOTE — Telephone Encounter (Signed)
Yes, as we discussed, those are potential side effects of Cymbalta (duloxetine). That is why I prescribed the ondansetron (Zofran) for her. 44% of people have nausea and vomiting when they take it, but 98% of those people have complete resolution if they stay on it for 2 weeks.  Since she stopped after one week, our options are: 1. Try again, and stick with it for 2 weeks, or 2. Try something else.  If we select option #2, I would recommend either trying Prozac again (she did well on this initially, some time ago) or trying venlafaxine (Effexor).

## 2017-03-21 NOTE — Telephone Encounter (Signed)
Please advise 

## 2017-03-22 MED ORDER — FLUOXETINE HCL 20 MG PO TABS: 20.0000 mg | ORAL_TABLET | Freq: Every day | ORAL | 1 refills | Status: DC

## 2017-03-22 NOTE — Telephone Encounter (Signed)
PT WANTS TO START ON PROZAC SHE STATES THAT THE ZOFRAN STILL NOT WORKING WITH HEADACHE AND NAUSEA

## 2017-03-22 NOTE — Telephone Encounter (Signed)
Meds ordered this encounter  Medications  . FLUoxetine (PROZAC) 20 MG tablet    Sig: Take 1 tablet (20 mg total) by mouth daily.    Dispense:  90 tablet    Refill:  1    Order Specific Question:   Supervising Provider    Answer:   SHAW, EVA N [[id number]]   She can INCREASE the dose to 40 mg daily. Plan to see me 7/31, as planned.

## 2017-03-23 ENCOUNTER — Encounter: Payer: Self-pay | Admitting: Physician Assistant

## 2017-03-23 MED ORDER — PROMETHAZINE HCL 25 MG PO TABS: 25.0000 mg | ORAL_TABLET | Freq: Three times a day (TID) | ORAL | 0 refills | Status: DC | PRN

## 2017-03-23 NOTE — Telephone Encounter (Signed)
Called patient, as per her request.  Her pharmacy won't fill the fluoxetine. They are waiting for her insurance to authorize it. Prior authorization has been initiated.  Zofran isn't helping the nausea, even though she has stopped the Cymbalta. Requests promethazine.  Meds ordered this encounter  Medications  . promethazine (PHENERGAN) 25 MG tablet    Sig: Take 1 tablet (25 mg total) by mouth every 8 (eight) hours as needed for nausea or vomiting.    Dispense:  20 tablet    Refill:  0    Order Specific Question:   Supervising Provider    Answer:   Brigitte Pulse, EVA N [[id number]]

## 2017-03-23 NOTE — Telephone Encounter (Signed)
Pt calling stating that the Prozac need prior authorization

## 2017-03-23 NOTE — Telephone Encounter (Signed)
Okay.  Thanks.

## 2017-03-23 NOTE — Telephone Encounter (Signed)
PA Started for Prozac. Awaiting Response

## 2017-03-23 NOTE — Telephone Encounter (Signed)
Di you have PA?

## 2017-03-23 NOTE — Telephone Encounter (Signed)
PT CALLED ABOUT RX I ADVISED HER THAT PROZAC WAS WRITTEN OUT FOR HER

## 2017-03-25 ENCOUNTER — Telehealth: Payer: Self-pay | Admitting: Physician Assistant

## 2017-03-25 NOTE — Telephone Encounter (Signed)
Received fax from [facility] that the Prozac isn't authorized unless she tries a formulary alternative, which includes fluoxetine.  It appears that the [facility] name product was sent, rather than the generic, though my order was for FLUOXETINE 20 mg.  Please contact the pharmacy to clarify that this is the problem, and authorize the [facility] product on the formulary.Marland Kitchen

## 2017-03-27 NOTE — Telephone Encounter (Signed)
Called pharmacy. Generic has been approved.

## 2017-03-28 ENCOUNTER — Encounter: Payer: Self-pay | Admitting: Physician Assistant

## 2017-03-28 ENCOUNTER — Ambulatory Visit (INDEPENDENT_AMBULATORY_CARE_PROVIDER_SITE_OTHER): Payer: Medicare Other | Admitting: Physician Assistant

## 2017-03-28 VITALS — BP 118/78 | HR 81 | Temp 97.5°F | Resp 18 | Ht 70.0 in | Wt 169.0 lb

## 2017-03-28 DIAGNOSIS — G8929 Other chronic pain: Secondary | ICD-10-CM | POA: Diagnosis not present

## 2017-03-28 DIAGNOSIS — M545 Low back pain, unspecified: Secondary | ICD-10-CM

## 2017-03-28 DIAGNOSIS — R110 Nausea: Secondary | ICD-10-CM | POA: Diagnosis not present

## 2017-03-28 DIAGNOSIS — E038 Other specified hypothyroidism: Secondary | ICD-10-CM | POA: Diagnosis not present

## 2017-03-28 DIAGNOSIS — F332 Major depressive disorder, recurrent severe without psychotic features: Secondary | ICD-10-CM

## 2017-03-28 DIAGNOSIS — F4310 Post-traumatic stress disorder, unspecified: Secondary | ICD-10-CM | POA: Diagnosis not present

## 2017-03-28 DIAGNOSIS — M542 Cervicalgia: Secondary | ICD-10-CM

## 2017-03-28 MED ORDER — DIAZEPAM 5 MG PO TABS: 5.0000 mg | ORAL_TABLET | Freq: Four times a day (QID) | ORAL | 0 refills | Status: DC | PRN

## 2017-03-28 MED ORDER — LEVOTHYROXINE SODIUM 88 MCG PO TABS: ORAL_TABLET | ORAL | 1 refills | Status: DC

## 2017-03-28 MED ORDER — RANITIDINE HCL 150 MG PO TABS: 150.0000 mg | ORAL_TABLET | Freq: Two times a day (BID) | ORAL | 3 refills | Status: DC

## 2017-03-28 NOTE — Progress Notes (Signed)
Patient ID: [name], female    DOB: 1956-07-25, 61 y.o.   MRN: [id number]  PCP: Harrison Mons, PA-C  Chief Complaint  Patient presents with  . Medication    per pt states when she tried Cymbalta it "almost killed her". Depression Scale score 12  . Follow-up  . Medication Refill    Valium 5 MG, Synthroid 88 MCG    Subjective:   Presents for evaluation of depression.  At her last visit, she was prescribed duloxetine. Unfortunately, she experienced terrible GI distress and was not able to continue it beyond just a few days. She contacted the office and fluoxetine was prescribed. Continues to have nausea and headaches daily. Promethazine isn't helping. Trying to hydrate with water and ginger ale. Not much appetite.  Has not yet started the fluoxetine. There has been a problem with her insurance and the pharmacy. The generic product was prescribed, but the pharmacy received the branded product and it was denied. Just today that issue has been clarified and the generic product is ready for her to pick up.  No energy. It's a struggle to do anything. I made myself take a shower because I was coming to see you today. I just want to feel good.  Labs were normal in 01/2017, including CMET, CBC, thyroid and lipids.   Review of Systems As above.    Patient Active Problem List   Diagnosis Date Noted  . Midline low back pain without sciatica 12/11/2015  . Hypothyroidism 02/18/2015  . Allergic rhinitis due to pollen 10/26/2011  . Chronic neck pain 10/26/2011  . [place] arthritis 10/26/2011  . CTS (carpal tunnel syndrome) 10/26/2011  . PTSD (post-traumatic stress disorder) 10/26/2011  . Hyperlipemia 10/26/2011  . Hearing loss in right ear 10/26/2011  . Major depressive disorder, recurrent episode ([place]) 10/26/2006     Prior to Admission medications   Medication Sig Start Date End Date Taking? Authorizing Provider  amitriptyline (ELAVIL) 100 MG tablet Take 1 tablet  (100 mg total) by mouth at bedtime. 01/31/17  Yes Johnathen Testa, PA-C  aspirin 81 MG tablet Take 81 mg by mouth daily.   Yes [provider]  atorvastatin (LIPITOR) 80 MG tablet Take 1 tablet (80 mg total) by mouth daily. 12/23/16  Yes Maejor Erven, PA-C  Calcium Carbonate (CALCIUM 600 PO) Take 1 capsule by mouth daily.   Yes [provider]  cyclobenzaprine (FLEXERIL) 10 MG tablet Take 1 tablet (10 mg total) by mouth 3 (three) times daily as needed. for muscle spams 08/16/16  Yes Ramsay Bognar, PA-C  diazepam (VALIUM) 5 MG tablet Take 1 tablet (5 mg total) by mouth every 6 (six) hours as needed. 02/28/17  Yes Lacinda Curvin, PA-C  FIBER PO Take 1 tablet by mouth daily.   Yes [provider]  fluticasone (FLONASE) 50 MCG/ACT nasal spray Place 2 sprays into both nostrils daily. 10/17/16  Yes Mayumi Summerson, PA-C  ketoconazole (NIZORAL) 2 % cream Apply 1 application topically daily. 01/31/17  Yes Avigdor Dollar, PA-C  levothyroxine (SYNTHROID, LEVOTHROID) 88 MCG tablet TAKE 1 TABLET BY MOUTH DAILY BEFORE BREAKFAST 09/23/16  Yes Arran Fessel, PA-C  Multiple Vitamin (MULTIVITAMIN) tablet Take 1 tablet by mouth daily.   Yes [provider]  ondansetron (ZOFRAN) 4 MG tablet Take 1 tablet (4 mg total) by mouth every 8 (eight) hours as needed for nausea or vomiting. 02/28/17  Yes Mckinzy Fuller, PA-C  promethazine (PHENERGAN) 25 MG tablet Take 1 tablet (25 mg total) by  mouth every 8 (eight) hours as needed for nausea or vomiting. 03/23/17  Yes Madalen Gavin, PA-C  tobramycin (TOBREX) 0.3 % ophthalmic ointment Place 1 application into the left eye 3 (three) times daily.   Yes [provider]  vitamin E 400 UNIT capsule Take 400 Units by mouth daily.   Yes [provider]  FLUoxetine (PROZAC) 20 MG tablet Take 1 tablet (20 mg total) by mouth daily. Patient not taking: Reported on 03/28/2017 03/22/17   Harrison Mons, PA-C  mometasone (NASONEX) 50  MCG/ACT nasal spray INSTILL 2 SPRAYS IN [facility] NOSTRIL DAILY Patient not taking: Reported on 02/28/2017 08/18/16   Harrison Mons, PA-C     Allergies  Allergen Reactions  . Codeine Shortness Of Breath, Nausea And Vomiting and Rash  . Penicillins Anaphylaxis  . Gabapentin     Didn't work, and made her feel really bad  . Morphine And Related   . Pravastatin Other (See Comments)    Chest wall and RUQ abdominal pain 05/2009  . Prednisone Other (See Comments)    Dizzy  . Rosuvastatin Other (See Comments)    11/2010  . Sudafed [Pseudoephedrine Hcl] Other (See Comments)    "Hyper"   . Sulfa Antibiotics Rash       Objective:  Physical Exam  Constitutional: She is oriented to person, place, and time. She appears well-developed and well-nourished. She is active and cooperative. No distress.  BP 118/78 (BP Location: Right Arm, Patient Position: Sitting, Cuff Size: Normal)   Pulse 81   Temp (!) 97.5 F (36.4 C) (Oral)   Resp 18   Ht 5\' 10"  (1.778 m)   Wt 169 lb (76.7 kg)   SpO2 96%   BMI 24.25 kg/m   HENT:  Head: Normocephalic and atraumatic.  Right Ear: Hearing normal.  Left Ear: Hearing normal.  Eyes: Conjunctivae are normal. No scleral icterus.  Neck: Normal range of motion. Neck supple. No thyromegaly present.  Cardiovascular: Normal rate, regular rhythm and normal heart sounds.   Pulses:      Radial pulses are 2+ on the right side, and 2+ on the left side.  Pulmonary/Chest: Effort normal and breath sounds normal.  Lymphadenopathy:       Head (right side): No tonsillar, no preauricular, no posterior auricular and no occipital adenopathy present.       Head (left side): No tonsillar, no preauricular, no posterior auricular and no occipital adenopathy present.    She has no cervical adenopathy.       Right: No supraclavicular adenopathy present.       Left: No supraclavicular adenopathy present.  Neurological: She is alert and oriented to person, place, and time. No sensory  deficit.  Skin: Skin is warm, dry and intact. No rash noted. No cyanosis or erythema. Nails show no clubbing.  Psychiatric: Her speech is normal and behavior is normal. Her mood appears not anxious. Her affect is not angry and not inappropriate. She exhibits a depressed mood.  T-shirt with [employer] character, Shelbie Ammons, saying "everything seems hopeless."        Assessment & Plan:   Problem List Items Addressed This Visit    Major depressive disorder, recurrent episode ([place]) - Primary    Has stopped duloxetine due to adverse effects. Encouraged her to start fluoxetine.      Relevant Medications   diazepam (VALIUM) 5 MG tablet   Chronic neck pain   Relevant Medications   diazepam (VALIUM) 5 MG tablet   PTSD (post-traumatic stress  disorder)   Relevant Medications   diazepam (VALIUM) 5 MG tablet   Hypothyroidism   Relevant Medications   levothyroxine (SYNTHROID, LEVOTHROID) 88 MCG tablet   Midline low back pain without sciatica   Relevant Medications   diazepam (VALIUM) 5 MG tablet    Other Visit Diagnoses    Nausea without vomiting       Relevant Medications   ranitidine (ZANTAC) 150 MG tablet       Return in about 4 weeks (around 04/25/2017) for re-evaluation of mood and nausea.   Fara Chute, PA-C [facility] [place]

## 2017-03-28 NOTE — Patient Instructions (Addendum)
Go ahead and pick up the Prozac (fluoxetine) from the pharmacy. Try the ranitidine (Zantac) twice a day to help the nausea. You can continue the Phenergan (promethazine) as needed.    IF you received an x-ray today, you will receive an invoice from [facility]. Please contact [facility] at [phone] with questions or concerns regarding your invoice.   IF you received labwork today, you will receive an invoice from [facility]. Please contact [facility] at [phone] with questions or concerns regarding your invoice.   Our billing staff will not be able to assist you with questions regarding bills from these companies.  You will be contacted with the lab results as soon as they are available. The fastest way to get your results is to activate your My Chart account. Instructions are located on the last page of this paperwork. If you have not heard from [place] regarding the results in 2 weeks, please contact this office.

## 2017-03-30 NOTE — Assessment & Plan Note (Signed)
Has stopped duloxetine due to adverse effects. Encouraged her to start fluoxetine.

## 2017-04-25 ENCOUNTER — Other Ambulatory Visit: Payer: Self-pay | Admitting: Physician Assistant

## 2017-04-25 ENCOUNTER — Ambulatory Visit: Payer: Medicare Other | Admitting: Physician Assistant

## 2017-04-25 DIAGNOSIS — F4310 Post-traumatic stress disorder, unspecified: Secondary | ICD-10-CM

## 2017-04-25 DIAGNOSIS — M545 Low back pain: Secondary | ICD-10-CM

## 2017-04-25 DIAGNOSIS — M542 Cervicalgia: Secondary | ICD-10-CM

## 2017-04-25 DIAGNOSIS — F332 Major depressive disorder, recurrent severe without psychotic features: Secondary | ICD-10-CM

## 2017-04-25 DIAGNOSIS — G8929 Other chronic pain: Secondary | ICD-10-CM

## 2017-04-27 MED ORDER — DIAZEPAM 5 MG PO TABS: 5.0000 mg | ORAL_TABLET | Freq: Four times a day (QID) | ORAL | 0 refills | Status: DC | PRN

## 2017-04-27 NOTE — Telephone Encounter (Signed)
Please call/fax to the pharmacy.  Patient notified via My Chart.  Meds ordered this encounter  Medications  . diazepam (VALIUM) 5 MG tablet    Sig: Take 1 tablet (5 mg total) by mouth every 6 (six) hours as needed.    Dispense:  120 tablet    Refill:  0

## 2017-04-27 NOTE — Telephone Encounter (Signed)
Pt made aware script was faxed to [facility] [address].

## 2017-04-28 ENCOUNTER — Ambulatory Visit: Payer: Medicare Other | Admitting: Physician Assistant

## 2017-05-06 ENCOUNTER — Encounter: Payer: Self-pay | Admitting: Physician Assistant

## 2017-05-06 ENCOUNTER — Ambulatory Visit: Payer: Medicare Other | Admitting: Physician Assistant

## 2017-05-08 ENCOUNTER — Encounter: Payer: Self-pay | Admitting: Physician Assistant

## 2017-05-08 DIAGNOSIS — F332 Major depressive disorder, recurrent severe without psychotic features: Secondary | ICD-10-CM

## 2017-05-09 ENCOUNTER — Other Ambulatory Visit: Payer: Self-pay | Admitting: Physician Assistant

## 2017-05-09 MED ORDER — FLUOXETINE HCL 20 MG PO TABS: 30.0000 mg | ORAL_TABLET | Freq: Every day | ORAL | 1 refills | Status: DC

## 2017-05-10 MED ORDER — PROMETHAZINE HCL 25 MG PO TABS: 25.0000 mg | ORAL_TABLET | Freq: Three times a day (TID) | ORAL | 0 refills | Status: DC | PRN

## 2017-05-10 NOTE — Addendum Note (Signed)
Addended by: Fara Chute on: 05/10/2017 06:31 PM   Modules accepted: Orders

## 2017-05-16 ENCOUNTER — Telehealth: Payer: Self-pay

## 2017-05-16 NOTE — Telephone Encounter (Signed)
Started request for fluoxetine 20mg  tabs 1 1/2 daily total 30 mg.  Cover my meds key code [id number].  Check status 24-72 hrs.

## 2017-05-17 ENCOUNTER — Encounter: Payer: Self-pay | Admitting: Physician Assistant

## 2017-05-17 ENCOUNTER — Ambulatory Visit (INDEPENDENT_AMBULATORY_CARE_PROVIDER_SITE_OTHER): Payer: Medicare Other | Admitting: Physician Assistant

## 2017-05-17 ENCOUNTER — Ambulatory Visit (INDEPENDENT_AMBULATORY_CARE_PROVIDER_SITE_OTHER): Payer: Medicare Other

## 2017-05-17 VITALS — BP 129/80 | HR 77 | Temp 98.7°F | Resp 16 | Ht 70.0 in | Wt 165.0 lb

## 2017-05-17 DIAGNOSIS — M25522 Pain in left elbow: Secondary | ICD-10-CM | POA: Diagnosis not present

## 2017-05-17 DIAGNOSIS — M7989 Other specified soft tissue disorders: Secondary | ICD-10-CM | POA: Diagnosis not present

## 2017-05-17 DIAGNOSIS — S59902A Unspecified injury of left elbow, initial encounter: Secondary | ICD-10-CM | POA: Diagnosis not present

## 2017-05-17 DIAGNOSIS — M25521 Pain in right elbow: Secondary | ICD-10-CM

## 2017-05-17 MED ORDER — FLUOXETINE HCL 40 MG PO CAPS: 40.0000 mg | ORAL_CAPSULE | Freq: Every day | ORAL | 3 refills | Status: DC

## 2017-05-17 NOTE — Progress Notes (Signed)
Patient ID: [name], female    DOB: 1955/09/28, 61 y.o.   MRN: [id number]  PCP: Harrison Mons, PA-C  Chief Complaint  Patient presents with  . Medication Refill    fell sunday, hurt left arm     Subjective:   Presents for evaluation of LEFT arm pain following a fall 4 days ago.  Was outside in the wet weather 4 days ago and slipped while feeding the cat. LEFT arm pain is terrible, especially at the elbow. Has a wound on the elbow. Feels like she really jarred her neck and back and shoulder.  RIGHT hand dominant. Tdap is current.  Pharmacy wouldn't fill the increased dose of fluoxetine, stating they required my authorization.  Has started some CBD oil use, and has experienced "maybe a little bit" of improvement.   Review of Systems As above.    Patient Active Problem List   Diagnosis Date Noted  . Midline low back pain without sciatica 12/11/2015  . Hypothyroidism 02/18/2015  . Allergic rhinitis due to pollen 10/26/2011  . Chronic neck pain 10/26/2011  . [place] arthritis 10/26/2011  . CTS (carpal tunnel syndrome) 10/26/2011  . PTSD (post-traumatic stress disorder) 10/26/2011  . Hyperlipemia 10/26/2011  . Hearing loss in right ear 10/26/2011  . Major depressive disorder, recurrent episode ([place]) 10/26/2006     Prior to Admission medications   Medication Sig Start Date End Date Taking? Authorizing Provider  aspirin 81 MG tablet Take 81 mg by mouth daily.   Yes [provider]  atorvastatin (LIPITOR) 80 MG tablet Take 1 tablet (80 mg total) by mouth daily. 12/23/16  Yes Jakim Drapeau, PA-C  Calcium Carbonate (CALCIUM 600 PO) Take 1 capsule by mouth daily.   Yes [provider]  cyclobenzaprine (FLEXERIL) 10 MG tablet Take 1 tablet (10 mg total) by mouth 3 (three) times daily as needed. for muscle spams 08/16/16  Yes Jaspal Pultz, PA-C  diazepam (VALIUM) 5 MG tablet Take 1 tablet (5 mg total) by mouth every 6 (six) hours as needed.  04/27/17  Yes Lessie Funderburke, PA-C  FIBER PO Take 1 tablet by mouth daily.   Yes [provider]  FLUoxetine (PROZAC) 20 MG tablet Take 1.5 tablets (30 mg total) by mouth daily. 05/09/17  Yes Iliza Blankenbeckler, PA-C  fluticasone (FLONASE) 50 MCG/ACT nasal spray Place 2 sprays into both nostrils daily. 10/17/16  Yes Arie Gable, PA-C  ketoconazole (NIZORAL) 2 % cream Apply 1 application topically daily. 01/31/17  Yes Marliyah Reid, PA-C  levothyroxine (SYNTHROID, LEVOTHROID) 88 MCG tablet TAKE 1 TABLET BY MOUTH DAILY BEFORE BREAKFAST 03/28/17  Yes Janin Kozlowski, PA-C  mometasone (NASONEX) 50 MCG/ACT nasal spray INSTILL 2 SPRAYS IN EACH NOSTRIL DAILY 08/18/16  Yes Anvita Hirata, PA-C  Multiple Vitamin (MULTIVITAMIN) tablet Take 1 tablet by mouth daily.   Yes [provider]  ondansetron (ZOFRAN) 4 MG tablet Take 1 tablet (4 mg total) by mouth every 8 (eight) hours as needed for nausea or vomiting. 02/28/17  Yes Lucious Zou, PA-C  promethazine (PHENERGAN) 25 MG tablet Take 1 tablet (25 mg total) by mouth every 8 (eight) hours as needed for nausea or vomiting. 05/10/17  Yes Tharun Cappella, PA-C  ranitidine (ZANTAC) 150 MG tablet Take 1 tablet (150 mg total) by mouth 2 (two) times daily. 03/28/17  Yes Cheney Ewart, PA-C  tobramycin (TOBREX) 0.3 % ophthalmic ointment Place 1 application into the left eye 3 (three) times daily.   Yes [provider]  vitamin  E 400 UNIT capsule Take 400 Units by mouth daily.   Yes [provider]     Allergies  Allergen Reactions  . Codeine Shortness Of Breath, Nausea And Vomiting and Rash  . Penicillins Anaphylaxis  . Gabapentin     Didn't work, and made her feel really bad  . Morphine And Related   . Pravastatin Other (See Comments)    Chest wall and RUQ abdominal pain 05/2009  . Prednisone Other (See Comments)    Dizzy  . Rosuvastatin Other (See Comments)    11/2010  . Sudafed [Pseudoephedrine Hcl] Other (See  Comments)    "Hyper"   . Sulfa Antibiotics Rash       Objective:  Physical Exam  Constitutional: She is oriented to person, place, and time. She appears well-developed and well-nourished. She is active and cooperative. No distress.  BP 129/80   Pulse 77   Temp 98.7 F (37.1 C)   Resp 16   Ht 5\' 10"  (1.778 m)   Wt 165 lb (74.8 kg)   SpO2 98%   BMI 23.68 kg/m   HENT:  Head: Normocephalic and atraumatic.  Right Ear: Hearing normal.  Left Ear: Hearing normal.  Eyes: Conjunctivae are normal. No scleral icterus.  Neck: Normal range of motion. Neck supple. No thyromegaly present.  Cardiovascular: Normal rate, regular rhythm and normal heart sounds.   Pulses:      Radial pulses are 2+ on the right side, and 2+ on the left side.  Pulmonary/Chest: Effort normal and breath sounds normal.  Musculoskeletal:       Left shoulder: She exhibits tenderness and pain. She exhibits normal range of motion, no bony tenderness, no swelling, no effusion, no crepitus, no deformity, no laceration, no spasm, normal pulse and normal strength.       Left elbow: She exhibits decreased range of motion. She exhibits no swelling, no effusion and no deformity. Lacerations: abrasion. Tenderness found. Olecranon process tenderness noted. No radial head, no medial epicondyle and no lateral epicondyle tenderness noted.       Left wrist: Normal.       Left upper arm: She exhibits tenderness. She exhibits no bony tenderness, no swelling, no edema, no deformity and no laceration.       Left forearm: Normal.       Left hand: Normal.  Lymphadenopathy:       Head (right side): No tonsillar, no preauricular, no posterior auricular and no occipital adenopathy present.       Head (left side): No tonsillar, no preauricular, no posterior auricular and no occipital adenopathy present.    She has no cervical adenopathy.       Right: No supraclavicular adenopathy present.       Left: No supraclavicular adenopathy present.    Neurological: She is alert and oriented to person, place, and time. No sensory deficit.  Skin: Skin is warm, dry and intact. No rash noted. No cyanosis or erythema. Nails show no clubbing.     Psychiatric: She has a normal mood and affect. Her speech is normal and behavior is normal.           Assessment & Plan:   1. Right elbow pain Await radiographs.  - DG ELBOW COMPLETE LEFT (3+VIEW); Future  I'll contact her pharmacy to see what the issue is with the fluoxetine.  Return for pending radiograph findings.Fara Chute, PA-C [facility]

## 2017-05-17 NOTE — Patient Instructions (Signed)
     IF you received an x-ray today, you will receive an invoice from [facility]. Please contact [facility] at [phone] with questions or concerns regarding your invoice.   IF you received labwork today, you will receive an invoice from [facility]. Please contact [facility] at [phone] with questions or concerns regarding your invoice.   Our billing staff will not be able to assist you with questions regarding bills from these companies.  You will be contacted with the lab results as soon as they are available. The fastest way to get your results is to activate your My Chart account. Instructions are located on the last page of this paperwork. If you have not heard from us regarding the results in 2 weeks, please contact this office.     

## 2017-05-22 NOTE — Telephone Encounter (Signed)
Medication was approved through cover my meds.  Patient notified on VM.

## 2017-05-28 ENCOUNTER — Other Ambulatory Visit: Payer: Self-pay | Admitting: Physician Assistant

## 2017-05-28 DIAGNOSIS — M545 Low back pain, unspecified: Secondary | ICD-10-CM

## 2017-05-28 DIAGNOSIS — G8929 Other chronic pain: Secondary | ICD-10-CM

## 2017-05-28 DIAGNOSIS — F4310 Post-traumatic stress disorder, unspecified: Secondary | ICD-10-CM

## 2017-05-28 DIAGNOSIS — M542 Cervicalgia: Secondary | ICD-10-CM

## 2017-05-28 DIAGNOSIS — F332 Major depressive disorder, recurrent severe without psychotic features: Secondary | ICD-10-CM

## 2017-05-29 ENCOUNTER — Other Ambulatory Visit: Payer: Self-pay | Admitting: Physician Assistant

## 2017-05-29 DIAGNOSIS — M545 Low back pain: Secondary | ICD-10-CM

## 2017-05-29 DIAGNOSIS — F4310 Post-traumatic stress disorder, unspecified: Secondary | ICD-10-CM

## 2017-05-29 DIAGNOSIS — G8929 Other chronic pain: Secondary | ICD-10-CM

## 2017-05-29 DIAGNOSIS — M542 Cervicalgia: Secondary | ICD-10-CM

## 2017-05-29 DIAGNOSIS — F332 Major depressive disorder, recurrent severe without psychotic features: Secondary | ICD-10-CM

## 2017-05-29 NOTE — Telephone Encounter (Signed)
Please advise 

## 2017-05-29 NOTE — Telephone Encounter (Signed)
Meds ordered this encounter  Medications  . diazepam (VALIUM) 5 MG tablet    Sig: TAKE 1 TABLET BY MOUTH EVERY 6 HOURS AS NEEDED    Dispense:  120 tablet    Refill:  0

## 2017-05-30 ENCOUNTER — Telehealth: Payer: Self-pay

## 2017-07-01 ENCOUNTER — Other Ambulatory Visit: Payer: Self-pay | Admitting: Physician Assistant

## 2017-07-01 ENCOUNTER — Telehealth: Payer: Self-pay | Admitting: Physician Assistant

## 2017-07-01 DIAGNOSIS — M545 Low back pain, unspecified: Secondary | ICD-10-CM

## 2017-07-01 DIAGNOSIS — F4310 Post-traumatic stress disorder, unspecified: Secondary | ICD-10-CM

## 2017-07-01 DIAGNOSIS — M542 Cervicalgia: Secondary | ICD-10-CM

## 2017-07-01 DIAGNOSIS — F332 Major depressive disorder, recurrent severe without psychotic features: Secondary | ICD-10-CM

## 2017-07-01 DIAGNOSIS — G8929 Other chronic pain: Secondary | ICD-10-CM

## 2017-07-01 NOTE — Telephone Encounter (Signed)
I don't think she takes Xanax. I think she takes Valium.  Please clarify.

## 2017-07-01 NOTE — Telephone Encounter (Signed)
Pt is needing a refill on her zanax she is completely out  Best number [phone]

## 2017-07-03 ENCOUNTER — Other Ambulatory Visit: Payer: Self-pay | Admitting: Physician Assistant

## 2017-07-03 ENCOUNTER — Telehealth: Payer: Self-pay | Admitting: Internal Medicine

## 2017-07-03 DIAGNOSIS — G8929 Other chronic pain: Secondary | ICD-10-CM

## 2017-07-03 DIAGNOSIS — F332 Major depressive disorder, recurrent severe without psychotic features: Secondary | ICD-10-CM

## 2017-07-03 DIAGNOSIS — F4310 Post-traumatic stress disorder, unspecified: Secondary | ICD-10-CM

## 2017-07-03 DIAGNOSIS — M542 Cervicalgia: Secondary | ICD-10-CM

## 2017-07-03 DIAGNOSIS — M545 Low back pain: Secondary | ICD-10-CM

## 2017-07-03 NOTE — Telephone Encounter (Signed)
Pt called and would like to become a new patient, her doctor retired and her mother used to come to you.  Please advise

## 2017-07-03 NOTE — Telephone Encounter (Signed)
Please advise 

## 2017-07-03 NOTE — Telephone Encounter (Signed)
Meds ordered this encounter  Medications  . diazepam (VALIUM) 5 MG tablet    Sig: TAKE 1 TABLET BY MOUTH EVERY 6 HOURS AS NEEDED    Dispense:  120 tablet    Refill:  0

## 2017-07-03 NOTE — Telephone Encounter (Signed)
Printed/authorized in a different message thread a few minutes ago.

## 2017-07-03 NOTE — Telephone Encounter (Signed)
PATIENT CALLED TO SAY SHE HAS BEEN OUT OF HER MEDICATION FOR SEVERAL DAYS AND SHE NEEDS IT TODAY. IT IS THE VALIUM SHE WANTS. (I WAS NOT SURE IF I NEEDED TO ROUTE MESSAGE BACK TO [name] OR THE CLINICAL POOL)? BEST PHONE [phone] (CELL) [place]

## 2017-07-04 ENCOUNTER — Telehealth: Payer: Self-pay

## 2017-07-04 NOTE — Telephone Encounter (Signed)
Very sorry, I am unable to accept at this time, thanks 

## 2017-07-06 NOTE — Telephone Encounter (Signed)
Tried to contact patient to notify of response both phone numbers unavailable

## 2017-07-12 ENCOUNTER — Ambulatory Visit: Payer: Self-pay | Admitting: Physician Assistant

## 2017-07-17 ENCOUNTER — Ambulatory Visit (INDEPENDENT_AMBULATORY_CARE_PROVIDER_SITE_OTHER): Payer: Medicare Other

## 2017-07-17 VITALS — BP 116/76 | HR 94 | Temp 98.3°F | Ht 70.0 in | Wt 161.5 lb

## 2017-07-17 DIAGNOSIS — Z0000 Encounter for general adult medical examination without abnormal findings: Secondary | ICD-10-CM

## 2017-07-17 NOTE — Progress Notes (Signed)
Subjective:   [name] is a 61 y.o. female who presents for an Initial Medicare Annual Wellness Visit.  Review of Systems    N/A  Cardiac Risk Factors include: dyslipidemia;Other (see comment), Risk factor comments: substance use     Objective:    Today's Vitals   07/17/17 1036 07/17/17 1044  BP: 116/76   Pulse: 94   Temp: 98.3 F (36.8 C)   TempSrc: Oral   SpO2: 97%   Weight: 161 lb 8 oz (73.3 kg)   Height: 5\' 10"  (1.778 m)   PainSc:  5    Body mass index is 23.17 kg/m.   Current Medications (verified) Outpatient Encounter Medications as of 07/17/2017  Medication Sig  . aspirin 81 MG tablet Take 81 mg by mouth daily.  Marland Kitchen atorvastatin (LIPITOR) 80 MG tablet Take 1 tablet (80 mg total) by mouth daily.  . Calcium Carbonate (CALCIUM 600 PO) Take 1 capsule by mouth daily.  . cyclobenzaprine (FLEXERIL) 10 MG tablet Take 1 tablet (10 mg total) by mouth 3 (three) times daily as needed. for muscle spams  . diazepam (VALIUM) 5 MG tablet TAKE 1 TABLET BY MOUTH EVERY 6 HOURS AS NEEDED  . FIBER PO Take 1 tablet by mouth daily.  Marland Kitchen FLUoxetine (PROZAC) 40 MG capsule Take 1 capsule (40 mg total) by mouth daily.  . fluticasone (FLONASE) 50 MCG/ACT nasal spray Place 2 sprays into both nostrils daily.  Marland Kitchen ketoconazole (NIZORAL) 2 % cream Apply 1 application topically daily.  Marland Kitchen levothyroxine (SYNTHROID, LEVOTHROID) 88 MCG tablet TAKE 1 TABLET BY MOUTH DAILY BEFORE BREAKFAST  . mometasone (NASONEX) 50 MCG/ACT nasal spray INSTILL 2 SPRAYS IN EACH NOSTRIL DAILY  . Multiple Vitamin (MULTIVITAMIN) tablet Take 1 tablet by mouth daily.  . ondansetron (ZOFRAN) 4 MG tablet Take 1 tablet (4 mg total) by mouth every 8 (eight) hours as needed for nausea or vomiting.  . promethazine (PHENERGAN) 25 MG tablet Take 1 tablet (25 mg total) by mouth every 8 (eight) hours as needed for nausea or vomiting.  . tobramycin (TOBREX) 0.3 % ophthalmic ointment Place 1 application into the left eye 3 (three)  times daily.  . vitamin E 400 UNIT capsule Take 400 Units by mouth daily.  . [DISCONTINUED] ranitidine (ZANTAC) 150 MG tablet Take 1 tablet (150 mg total) by mouth 2 (two) times daily.   No facility-administered encounter medications on file as of 07/17/2017.     Allergies (verified) Codeine; Penicillins; Gabapentin; Morphine and related; Pravastatin; Prednisone; Rosuvastatin; Sudafed [pseudoephedrine hcl]; and Sulfa antibiotics   History: Past Medical History:  Diagnosis Date  . Allergic rhinitis   . Allergy   . Anemia   . Anxiety   . Carpal tunnel syndrome   . Chronic prescription opiate use 05/20/2016   Due to DDD if the neck and lumbar spine  . Colon polyp    adenomatous  . Depression   . Gallstones   . Graves disease    thyroid disease  . Heart murmur    as a child  . Hepatic steatosis   . Hyperlipidemia   . Inguinal hernia   . Osteoarthritis   . Osteoporosis   . Pneumonia   . PTSD (post-traumatic stress disorder)   . Toxic diffuse goiter   . Vertigo    Past Surgical History:  Procedure Laterality Date  . APPENDECTOMY    . CERVICAL DISC SURGERY     with cages and stem cell  . CHOLECYSTECTOMY    . INGUINAL  HERNIA REPAIR Bilateral   . TONSILLECTOMY AND ADENOIDECTOMY     Family History  Problem Relation Age of Onset  . COPD Mother   . Atrial fibrillation Mother   . Huntington's disease Mother   . Heart disease Father   . Lung cancer Maternal Grandmother   . Heart disease Paternal Grandmother        heart attack  . Heart disease Paternal Grandfather   . Heart disease Brother   . Alcohol abuse Brother   . Colon cancer Neg Hx   . Rectal cancer Neg Hx   . Stomach cancer Neg Hx    Social History   Occupational History  . Occupation: Stage manager: DISABLED     Comment: disabled since 2005 (spinal stenosis, PTSD, medication effects)  Tobacco Use  . Smoking status: Former Smoker    Types: Cigarettes    Last attempt to quit: 07/29/2004      Years since quitting: 12.9  . Smokeless tobacco: Never Used  Substance and Sexual Activity  . Alcohol use: No  . Drug use: Yes    Frequency: 5.0 times per week    Types: Marijuana    Comment: usually at night, for pain, "it really helps me."  . Sexual activity: Yes    Partners: Male    Birth control/protection: None    Tobacco Counseling Counseling given: Not Answered   Activities of Daily Living In your present state of health, do you have any difficulty performing the following activities: 07/17/2017  Hearing? Y  Comment Patient does have hearing loss.   Vision? Y  Comment Patient does have vision issues even with glasses.   Difficulty concentrating or making decisions? Y  Comment Patient has issues with concentrating and remembering things.   Walking or climbing stairs? N  Dressing or bathing? N  Doing errands, shopping? N  Preparing Food and eating ? N  Using the Toilet? N  In the past six months, have you accidently leaked urine? Y  Comment Patient has stress incontinence  Do you have problems with loss of bowel control? N  Managing your Medications? N  Managing your Finances? N  Housekeeping or managing your Housekeeping? N  Some recent data might be hidden    Immunizations and Health Maintenance Immunization History  Administered Date(s) Administered  . Td 08/30/2003  . Tdap 08/29/2010   There are no preventive care reminders to display for this patient.  Patient Care Team: Harrison Mons, PA-C as PCP - General (Family Medicine) Rutherford Guys, MD as Attending Physician (Ophthalmology) Vania Rea, MD as Consulting Physician (Obstetrics and Gynecology) Irene Shipper, MD as Consulting Physician (Gastroenterology)  Indicate any recent Medical Services you may have received from other than [facility] providers in the past year (date may be approximate).     Assessment:   This is a routine wellness examination for [name].   Hearing/Vision screen Hearing  Screening Comments: Patient has had a hearing test in the past and she needs a hearing aid. She can't afford it currently.  Vision Screening Comments: Patient sees Dr. Gershon Crane yearly for her eye exams.   Dietary issues and exercise activities discussed: Current Exercise Habits: The patient does not participate in regular exercise at present, Exercise limited by: None identified  Goals    . Exercise 3x per week (30 min per time)     Patient wants to try to start exercising on a more consistent basis.       Depression Screen  PHQ 2/9 Scores 07/17/2017 05/17/2017 03/28/2017 02/28/2017 01/31/2017 08/16/2016 04/19/2016  PHQ - 2 Score 6 3 3 3 2  0 0  PHQ- 9 Score 21 20 12 8 9  - -    Fall Risk Fall Risk  07/17/2017 05/17/2017 03/28/2017 02/28/2017 01/31/2017  Falls in the past year? Yes No No No No  Number falls in past yr: 1 - - - -  Injury with Fall? Yes - - - -  Comment Hit her left elbow, no fracture - - - -  Risk for fall due to : Other (Comment) - - - -  Risk for fall due to: Comment Patient slipped on some ice - - - -  Follow up Falls prevention discussed - - - -    Cognitive Function:     6CIT Screen 07/17/2017  What Year? 0 points  What month? 0 points  What time? 0 points  Count back from 20 0 points  Months in reverse 0 points  Repeat phrase 2 points  Total Score 2    Screening Tests Health Maintenance  Topic Date Due  . INFLUENZA VACCINE  11/26/2017 (Originally 03/29/2017)  . MAMMOGRAM  12/06/2018  . COLONOSCOPY  07/10/2019  . PAP SMEAR  12/06/2019  . TETANUS/TDAP  08/29/2020  . Hepatitis C Screening  Completed  . HIV Screening  Completed      Plan:   I have personally reviewed and noted the following in the patient's chart:   . Medical and social history . Use of alcohol, tobacco or illicit drugs  . Current medications and supplements . Functional ability and status . Nutritional status . Physical activity . Advanced directives . List of other  physicians . Hospitalizations, surgeries, and ER visits in previous 12 months . Vitals . Screenings to include cognitive, depression, and falls . Referrals and appointments  In addition, I have reviewed and discussed with patient certain preventive protocols, quality metrics, and best practice recommendations. A written personalized care plan for preventive services as well as general preventive health recommendations were provided to patient.  Patient declined flu and shingles vaccine.   1. Encounter for Medicare annual wellness exam    Andrez Grime, LPN   [id number]

## 2017-07-17 NOTE — Patient Instructions (Addendum)
[name] , Thank you for taking time to come for your Medicare Wellness Visit. I appreciate your ongoing commitment to your health goals. Please review the following plan we discussed and let me know if I can assist you in the future.   Screening recommendations/referrals: Colonoscopy: up to date, next due 03/24/2020 Mammogram: up to date, next due 12/06/2018 Bone Density: age 61  Recommended yearly ophthalmology/optometry visit for glaucoma screening and checkup Recommended yearly dental visit for hygiene and checkup  Vaccinations: Influenza vaccine: declined  Pneumococcal vaccine: age 61 Tdap vaccine: up to date, next due 08/29/2020 Shingles vaccine: declined  Advanced directives: Advance directive discussed with you today. Even though you declined this today please call our office should you change your mind and we can give you the proper paperwork for you to fill out.   Conditions/risks identified: Try to start exercising on a more consistent basis.   Next appointment: schedule follow up visit with PCP, next AWV in 1 year   Preventive Care 40-64 Years, Female Preventive care refers to lifestyle choices and visits with your health care provider that can promote health and wellness. What does preventive care include?  A yearly physical exam. This is also called an annual well check.  Dental exams once or twice a year.  Routine eye exams. Ask your health care provider how often you should have your eyes checked.  Personal lifestyle choices, including:  Daily care of your teeth and gums.  Regular physical activity.  Eating a healthy diet.  Avoiding tobacco and drug use.  Limiting alcohol use.  Practicing safe sex.  Taking low-dose aspirin daily starting at age 17.  Taking vitamin and mineral supplements as recommended by your health care provider. What happens during an annual well check? The services and screenings done by your health care provider during your annual  well check will depend on your age, overall health, lifestyle risk factors, and family history of disease. Counseling  Your health care provider may ask you questions about your:  Alcohol use.  Tobacco use.  Drug use.  Emotional well-being.  Home and relationship well-being.  Sexual activity.  Eating habits.  Work and work Statistician.  Method of birth control.  Menstrual cycle.  Pregnancy history. Screening  You may have the following tests or measurements:  Height, weight, and BMI.  Blood pressure.  Lipid and cholesterol levels. These may be checked every 5 years, or more frequently if you are over 29 years old.  Skin check.  Lung cancer screening. You may have this screening every year starting at age 31 if you have a 30-pack-year history of smoking and currently smoke or have quit within the past 15 years.  Fecal occult blood test (FOBT) of the stool. You may have this test every year starting at age 76.  Flexible sigmoidoscopy or colonoscopy. You may have a sigmoidoscopy every 5 years or a colonoscopy every 10 years starting at age 42.  Hepatitis C blood test.  Hepatitis B blood test.  Sexually transmitted disease (STD) testing.  Diabetes screening. This is done by checking your blood sugar (glucose) after you have not eaten for a while (fasting). You may have this done every 1-3 years.  Mammogram. This may be done every 1-2 years. Talk to your health care provider about when you should start having regular mammograms. This may depend on whether you have a family history of breast cancer.  BRCA-related cancer screening. This may be done if you have a family history  of breast, ovarian, tubal, or peritoneal cancers.  Pelvic exam and Pap test. This may be done every 3 years starting at age 63. Starting at age 63, this may be done every 5 years if you have a Pap test in combination with an HPV test.  Bone density scan. This is done to screen for osteoporosis.  You may have this scan if you are at high risk for osteoporosis. Discuss your test results, treatment options, and if necessary, the need for more tests with your health care provider. Vaccines  Your health care provider may recommend certain vaccines, such as:  Influenza vaccine. This is recommended every year.  Tetanus, diphtheria, and acellular pertussis (Tdap, Td) vaccine. You may need a Td booster every 10 years.  Zoster vaccine. You may need this after age 63.  Pneumococcal 13-valent conjugate (PCV13) vaccine. You may need this if you have certain conditions and were not previously vaccinated.  Pneumococcal polysaccharide (PPSV23) vaccine. You may need one or two doses if you smoke cigarettes or if you have certain conditions. Talk to your health care provider about which screenings and vaccines you need and how often you need them. This information is not intended to replace advice given to you by your health care provider. Make sure you discuss any questions you have with your health care provider. Document Released: 09/11/2015 Document Revised: 05/04/2016 Document Reviewed: 06/16/2015 Elsevier Interactive Patient Education  2017 [place] Prevention in the Home Falls can cause injuries. They can happen to people of all ages. There are many things you can do to make your home safe and to help prevent falls. What can I do on the outside of my home?  Regularly fix the edges of walkways and driveways and fix any cracks.  Remove anything that might make you trip as you walk through a door, such as a raised step or threshold.  Trim any bushes or trees on the path to your home.  Use bright outdoor lighting.  Clear any walking paths of anything that might make someone trip, such as rocks or tools.  Regularly check to see if handrails are loose or broken. Make sure that both sides of any steps have handrails.  Any raised decks and porches should have guardrails on  the edges.  Have any leaves, snow, or ice cleared regularly.  Use sand or salt on walking paths during winter.  Clean up any spills in your garage right away. This includes oil or grease spills. What can I do in the bathroom?  Use night lights.  Install grab bars by the toilet and in the tub and shower. Do not use towel bars as grab bars.  Use non-skid mats or decals in the tub or shower.  If you need to sit down in the shower, use a plastic, non-slip stool.  Keep the floor dry. Clean up any water that spills on the floor as soon as it happens.  Remove soap buildup in the tub or shower regularly.  Attach bath mats securely with double-sided non-slip rug tape.  Do not have throw rugs and other things on the floor that can make you trip. What can I do in the bedroom?  Use night lights.  Make sure that you have a light by your bed that is easy to reach.  Do not use any sheets or blankets that are too big for your bed. They should not hang down onto the floor.  Have a firm chair that  has side arms. You can use this for support while you get dressed.  Do not have throw rugs and other things on the floor that can make you trip. What can I do in the kitchen?  Clean up any spills right away.  Avoid walking on wet floors.  Keep items that you use a lot in easy-to-reach places.  If you need to reach something above you, use a strong step stool that has a grab bar.  Keep electrical cords out of the way.  Do not use floor polish or wax that makes floors slippery. If you must use wax, use non-skid floor wax.  Do not have throw rugs and other things on the floor that can make you trip. What can I do with my stairs?  Do not leave any items on the stairs.  Make sure that there are handrails on both sides of the stairs and use them. Fix handrails that are broken or loose. Make sure that handrails are as long as the stairways.  Check any carpeting to make sure that it is firmly  attached to the stairs. Fix any carpet that is loose or worn.  Avoid having throw rugs at the top or bottom of the stairs. If you do have throw rugs, attach them to the floor with carpet tape.  Make sure that you have a light switch at the top of the stairs and the bottom of the stairs. If you do not have them, ask someone to add them for you. What else can I do to help prevent falls?  Wear shoes that:  Do not have high heels.  Have rubber bottoms.  Are comfortable and fit you well.  Are closed at the toe. Do not wear sandals.  If you use a stepladder:  Make sure that it is fully opened. Do not climb a closed stepladder.  Make sure that both sides of the stepladder are locked into place.  Ask someone to hold it for you, if possible.  Clearly mark and make sure that you can see:  Any grab bars or handrails.  First and last steps.  Where the edge of each step is.  Use tools that help you move around (mobility aids) if they are needed. These include:  Canes.  Walkers.  Scooters.  Crutches.  Turn on the lights when you go into a dark area. Replace any light bulbs as soon as they burn out.  Set up your furniture so you have a clear path. Avoid moving your furniture around.  If any of your floors are uneven, fix them.  If there are any pets around you, be aware of where they are.  Review your medicines with your doctor. Some medicines can make you feel dizzy. This can increase your chance of falling. Ask your doctor what other things that you can do to help prevent falls. This information is not intended to replace advice given to you by your health care provider. Make sure you discuss any questions you have with your health care provider. Document Released: 06/11/2009 Document Revised: 01/21/2016 Document Reviewed: 09/19/2014 Elsevier Interactive Patient Education  2017 [employer].

## 2017-07-21 ENCOUNTER — Ambulatory Visit (INDEPENDENT_AMBULATORY_CARE_PROVIDER_SITE_OTHER): Payer: Medicare Other | Admitting: Family Medicine

## 2017-07-21 ENCOUNTER — Ambulatory Visit (INDEPENDENT_AMBULATORY_CARE_PROVIDER_SITE_OTHER): Payer: Medicare Other

## 2017-07-21 ENCOUNTER — Encounter: Payer: Self-pay | Admitting: Family Medicine

## 2017-07-21 VITALS — BP 110/76 | HR 82 | Temp 98.3°F | Resp 18 | Ht 70.0 in | Wt 160.8 lb

## 2017-07-21 DIAGNOSIS — R1011 Right upper quadrant pain: Secondary | ICD-10-CM

## 2017-07-21 DIAGNOSIS — F4310 Post-traumatic stress disorder, unspecified: Secondary | ICD-10-CM

## 2017-07-21 DIAGNOSIS — M542 Cervicalgia: Secondary | ICD-10-CM

## 2017-07-21 DIAGNOSIS — E038 Other specified hypothyroidism: Secondary | ICD-10-CM | POA: Diagnosis not present

## 2017-07-21 DIAGNOSIS — R748 Abnormal levels of other serum enzymes: Secondary | ICD-10-CM | POA: Diagnosis not present

## 2017-07-21 DIAGNOSIS — G8929 Other chronic pain: Secondary | ICD-10-CM

## 2017-07-21 DIAGNOSIS — M545 Low back pain, unspecified: Secondary | ICD-10-CM

## 2017-07-21 DIAGNOSIS — R140 Abdominal distension (gaseous): Secondary | ICD-10-CM | POA: Diagnosis not present

## 2017-07-21 DIAGNOSIS — K861 Other chronic pancreatitis: Secondary | ICD-10-CM | POA: Diagnosis not present

## 2017-07-21 DIAGNOSIS — R079 Chest pain, unspecified: Secondary | ICD-10-CM | POA: Diagnosis not present

## 2017-07-21 DIAGNOSIS — F332 Major depressive disorder, recurrent severe without psychotic features: Secondary | ICD-10-CM | POA: Diagnosis not present

## 2017-07-21 LAB — POCT URINALYSIS DIP (MANUAL ENTRY)
Bilirubin, UA: NEGATIVE
Glucose, UA: NEGATIVE mg/dL
Ketones, POC UA: NEGATIVE mg/dL
Leukocytes, UA: NEGATIVE
NITRITE UA: NEGATIVE
PH UA: 7 (ref 5.0–8.0)
PROTEIN UA: NEGATIVE mg/dL
RBC UA: NEGATIVE
SPEC GRAV UA: 1.01 (ref 1.010–1.025)
UROBILINOGEN UA: 0.2 U/dL

## 2017-07-21 LAB — POC MICROSCOPIC URINALYSIS (UMFC): Mucus: ABSENT

## 2017-07-21 LAB — POCT SEDIMENTATION RATE: POCT SED RATE: 8 mm/hr (ref 0–22)

## 2017-07-21 MED ORDER — OMEPRAZOLE 40 MG PO CPDR: 40.0000 mg | DELAYED_RELEASE_CAPSULE | Freq: Every day | ORAL | 3 refills | Status: AC

## 2017-07-21 MED ORDER — AMITRIPTYLINE HCL 50 MG PO TABS: ORAL_TABLET | ORAL | 1 refills | Status: DC

## 2017-07-21 MED ORDER — DIAZEPAM 5 MG PO TABS: 5.0000 mg | ORAL_TABLET | Freq: Four times a day (QID) | ORAL | 0 refills | Status: DC | PRN

## 2017-07-21 NOTE — Patient Instructions (Addendum)
IF you received an x-ray today, you will receive an invoice from [facility]. Please contact [facility] at [phone] with questions or concerns regarding your invoice.   IF you received labwork today, you will receive an invoice from [facility]. Please contact [facility] at [phone] with questions or concerns regarding your invoice.   Our billing staff will not be able to assist you with questions regarding bills from these companies.  You will be contacted with the lab results as soon as they are available. The fastest way to get your results is to activate your My Chart account. Instructions are located on the last page of this paperwork. If you have not heard from [place] regarding the results in 2 weeks, please contact this office.      Hypothyroidism Hypothyroidism is a disorder of the thyroid. The thyroid is a large gland that is located in the lower front of the neck. The thyroid releases hormones that control how the body works. With hypothyroidism, the thyroid does not make enough of these hormones. What are the causes? Causes of hypothyroidism may include:  Viral infections.  Pregnancy.  Your own defense system (immune system) attacking your thyroid.  Certain medicines.  Birth defects.  Past radiation treatments to your head or neck.  Past treatment with radioactive iodine.  Past surgical removal of part or all of your thyroid.  Problems with the gland that is located in the center of your brain (pituitary).  What are the signs or symptoms? Signs and symptoms of hypothyroidism may include:  Feeling as though you have no energy (lethargy).  Inability to tolerate cold.  Weight gain that is not explained by a change in diet or exercise habits.  Dry skin.  Coarse hair.  Menstrual irregularity.  Slowing of thought processes.  Constipation.  Sadness or depression.  How is this diagnosed? Your health care provider may diagnose  hypothyroidism with blood tests and ultrasound tests. How is this treated? Hypothyroidism is treated with medicine that replaces the hormones that your body does not make. After you begin treatment, it may take several weeks for symptoms to go away. Follow these instructions at home:  Take medicines only as directed by your health care provider.  If you start taking any new medicines, tell your health care provider.  Keep all follow-up visits as directed by your health care provider. This is important. As your condition improves, your dosage needs may change. You will need to have blood tests regularly so that your health care provider can watch your condition. Contact a health care provider if:  Your symptoms do not get better with treatment.  You are taking thyroid replacement medicine and: ? You sweat excessively. ? You have tremors. ? You feel anxious. ? You lose weight rapidly. ? You cannot tolerate heat. ? You have emotional swings. ? You have diarrhea. ? You feel weak. Get help right away if:  You develop chest pain.  You develop an irregular heartbeat.  You develop a rapid heartbeat. This information is not intended to replace advice given to you by your health care provider. Make sure you discuss any questions you have with your health care provider. Document Released: 08/15/2005 Document Revised: 01/21/2016 Document Reviewed: 12/31/2013 Elsevier Interactive Patient Education  2017 [employer]  Fatty Liver Fatty liver, also called hepatic steatosis or steatohepatitis, is a condition in which too much fat has built up in your liver cells. The liver removes harmful substances from your bloodstream. It produces fluids  your body needs. It also helps your body use and store energy from the food you eat. In many cases, fatty liver does not cause symptoms or problems. It is often diagnosed when tests are being done for other reasons. However, over time, fatty liver can cause  inflammation that may lead to more serious liver problems, such as scarring of the liver (cirrhosis). What are the causes? Causes of fatty liver may include:  Drinking too much alcohol.  Poor nutrition.  Obesity.  Cushing syndrome.  Diabetes.  Hyperlipidemia.  Pregnancy.  Certain drugs.  Poisons.  Some viral infections.  What increases the risk? You may be more likely to develop fatty liver if you:  Abuse alcohol.  Are pregnant.  Are overweight.  Have diabetes.  Have hepatitis.  Have a high triglyceride level.  What are the signs or symptoms? Fatty liver often does not cause any symptoms. In cases where symptoms develop, they can include:  Fatigue.  Weakness.  Weight loss.  Confusion.  Abdominal pain.  Yellowing of your skin and the white parts of your eyes (jaundice).  Nausea and vomiting.  How is this diagnosed? Fatty liver may be diagnosed by:  Physical exam and medical history.  Blood tests.  Imaging tests, such as an ultrasound, CT scan, or MRI.  Liver biopsy. A small sample of liver tissue is removed using a needle. The sample is then looked at under a microscope.  How is this treated? Fatty liver is often caused by other health conditions. Treatment for fatty liver may involve medicines and lifestyle changes to manage conditions such as:  Alcoholism.  High cholesterol.  Diabetes.  Being overweight or obese.  Follow these instructions at home:  Eat a healthy diet as directed by your health care provider.  Exercise regularly. This can help you lose weight and control your cholesterol and diabetes. Talk to your health care provider about an exercise plan and which activities are best for you.  Do not drink alcohol.  Take medicines only as directed by your health care provider. Contact a health care provider if: You have difficulty controlling your:  Blood sugar.  Cholesterol.  Alcohol consumption.  Get help right away  if:  You have abdominal pain.  You have jaundice.  You have nausea and vomiting. This information is not intended to replace advice given to you by your health care provider. Make sure you discuss any questions you have with your health care provider. Document Released: 09/30/2005 Document Revised: 01/21/2016 Document Reviewed: 12/25/2013 Elsevier Interactive Patient Education  2018 [place] Diet for Pancreatitis or Gallbladder Conditions A low-fat diet can be helpful if you have pancreatitis or a gallbladder condition. With these conditions, your pancreas and gallbladder have trouble digesting fats. A healthy eating plan with less fat will help rest your pancreas and gallbladder and reduce your symptoms. What do I need to know about this diet?  Eat a low-fat diet. ? Reduce your fat intake to less than 20-30% of your total daily calories. This is less than 50-60 g of fat per day. ? Remember that you need some fat in your diet. Ask your dietician what your daily goal should be. ? Choose nonfat and low-fat healthy foods. Look for the words "nonfat," "low fat," or "fat free." ? As a guide, look on the label and choose foods with less than 3 g of fat per serving. Eat only one serving.  Avoid alcohol.  Do not smoke. If you need help quitting, talk  with your health care provider.  Eat small frequent meals instead of three large heavy meals. What foods can I eat? Grains Include healthy grains and starches such as potatoes, wheat bread, fiber-rich cereal, and brown rice. Choose whole grain options whenever possible. In adults, whole grains should account for 45-65% of your daily calories. Fruits and Vegetables Eat plenty of fruits and vegetables. Fresh fruits and vegetables add fiber to your diet. Meats and Other Protein Sources Eat lean meat such as chicken and pork. Trim any fat off of meat before cooking it. Eggs, fish, and beans are other sources of protein. In adults, these  foods should account for 10-35% of your daily calories. Dairy Choose low-fat milk and dairy options. Dairy includes fat and protein, as well as calcium. Fats and Oils Limit high-fat foods such as fried foods, sweets, baked goods, sugary drinks. Other Creamy sauces and condiments, such as mayonnaise, can add extra fat. Think about whether or not you need to use them, or use smaller amounts or low fat options. What foods are not recommended?  High fat foods, such as: ? [employer]. ? Ice cream. ? [place] toast. ? Sweet rolls. ? Pizza. ? Cheese bread. ? Foods covered with batter, butter, creamy sauces, or cheese. ? Fried foods. ? Sugary drinks and desserts.  Foods that cause gas or bloating This information is not intended to replace advice given to you by your health care provider. Make sure you discuss any questions you have with your health care provider. Document Released: 08/20/2013 Document Revised: 01/21/2016 Document Reviewed: 07/29/2013 Elsevier Interactive Patient Education  2017 [employer].

## 2017-07-21 NOTE — Progress Notes (Addendum)
Subjective:  By signing my name below, I, [name], attest that this documentation has been prepared under the direction and in the presence of Delman Cheadle, MD Electronically Signed: Ladene Artist, ED Scribe 07/21/2017 at 11:33 AM.   Patient ID: [name], female    DOB: 10-17-1955, 61 y.o.   MRN: [id number]  Chief Complaint  Patient presents with  . Graves' Disease    wants to discuss her condition and have blood work to check levels   HPI [name] is a 61 y.o. female who presents to Primary Care at [facility] for follow-up of Graves' Disease. Previous pt of Dr. Laney Pastor, seeing [name] since his retirement. Has not seen anyone else in our practice. Did try to transfer her care to Dr. Cathlean Cower at [facility] earlier this month though he declined, not accepting new pts. Pt had an appointment with her PCP Harrison Mons, PA-C last week though she cancelled. She was seen earlier this week by Dewitt Hoes, LPN for her AWV and does not have any follow-up CPE scheduled. She was started on duloxetine 4 months prior. Did not tolerate duloxetine for even a few days due to GI side effects, stating "it almost killed me". Is on diazepam 5 mg qid PRN #120 written on 11/5 with no refills. This is chronic for chronic neck pain, low back pain and PTSD, depression. Has been stable on levothyroxine 88 mcg with the last normal TSH and free T4 5 months prior. Drug database shows that [name] is her only prescriber and that she does receive #120 diazepam monthly which was started by her prior provider Dr. Laney Pastor and she has been on for many yrs. She was previously maintained on meperidine 100 mg qid though was weaned off of this once Dr. Laney Pastor retired.   Pt reports that she was diagnosed with Graves Disease around 2005 and has been treated with radioactive iodine in the past. States she has been on the same dose of 88 mcg of synthroid for 2 years. Recently she has been experiencing a decreased appetite,  ~25 lb weight loss over the past 1.5 year, dry skin, nausea and intermittent RUQ abdominal pain that she attributes to not eating, depressed mood. Reports that she has had RUQ pain x 15 years and was told she had a fatty liver. Also states "I'm so depressed that I can't make myself eat". Reports having 1 meal/daily around late afternoon or dinner. Denies vomiting since stopping Cymbalta, heartburn, indigestion. Pt states that she felt like amitriptyline was making her "feel too much" and she felt like Prozac was even worse. She takes a multivitamin, calcium and fiber. She has not seen her gastroenterologist Dr. Henrene Pastor within the past 2.5 years. She has seen a psychiatrist in the past. No diagnosis of bipolar.   Wt Readings from Last 3 Encounters:  07/21/17 160 lb 12.8 oz (72.9 kg)  07/17/17 161 lb 8 oz (73.3 kg)  05/17/17 165 lb (74.8 kg)   Past Medical History:  Diagnosis Date  . Allergic rhinitis   . Allergy   . Anemia   . Anxiety   . Carpal tunnel syndrome   . Chronic prescription opiate use 05/20/2016   Due to DDD if the neck and lumbar spine  . Colon polyp    adenomatous  . Depression   . Gallstones   . Graves disease    thyroid disease  . Heart murmur    as a child  . Hepatic steatosis   . Hyperlipidemia   .  Inguinal hernia   . Osteoarthritis   . Osteoporosis   . Pneumonia   . PTSD (post-traumatic stress disorder)   . Toxic diffuse goiter   . Vertigo    Current Outpatient Medications on File Prior to Visit  Medication Sig Dispense Refill  . aspirin 81 MG tablet Take 81 mg by mouth daily.    Marland Kitchen atorvastatin (LIPITOR) 80 MG tablet Take 1 tablet (80 mg total) by mouth daily. 90 tablet 3  . Calcium Carbonate (CALCIUM 600 PO) Take 1 capsule by mouth daily.    . cyclobenzaprine (FLEXERIL) 10 MG tablet Take 1 tablet (10 mg total) by mouth 3 (three) times daily as needed. for muscle spams 90 tablet 5  . diazepam (VALIUM) 5 MG tablet TAKE 1 TABLET BY MOUTH EVERY 6 HOURS AS NEEDED  120 tablet 0  . FIBER PO Take 1 tablet by mouth daily.    Marland Kitchen FLUoxetine (PROZAC) 40 MG capsule Take 1 capsule (40 mg total) by mouth daily. 90 capsule 3  . fluticasone (FLONASE) 50 MCG/ACT nasal spray Place 2 sprays into both nostrils daily. 16 g 12  . ketoconazole (NIZORAL) 2 % cream Apply 1 application topically daily. 30 g 0  . levothyroxine (SYNTHROID, LEVOTHROID) 88 MCG tablet TAKE 1 TABLET BY MOUTH DAILY BEFORE BREAKFAST 90 tablet 1  . mometasone (NASONEX) 50 MCG/ACT nasal spray INSTILL 2 SPRAYS IN EACH NOSTRIL DAILY 51 g 3  . Multiple Vitamin (MULTIVITAMIN) tablet Take 1 tablet by mouth daily.    . ondansetron (ZOFRAN) 4 MG tablet Take 1 tablet (4 mg total) by mouth every 8 (eight) hours as needed for nausea or vomiting. 30 tablet 0  . promethazine (PHENERGAN) 25 MG tablet Take 1 tablet (25 mg total) by mouth every 8 (eight) hours as needed for nausea or vomiting. 20 tablet 0  . tobramycin (TOBREX) 0.3 % ophthalmic ointment Place 1 application into the left eye 3 (three) times daily.    . vitamin E 400 UNIT capsule Take 400 Units by mouth daily.     No current facility-administered medications on file prior to visit.    Allergies  Allergen Reactions  . Codeine Shortness Of Breath, Nausea And Vomiting and Rash  . Penicillins Anaphylaxis  . Gabapentin     Didn't work, and made her feel really bad  . Morphine And Related   . Pravastatin Other (See Comments)    Chest wall and RUQ abdominal pain 05/2009  . Prednisone Other (See Comments)    Dizzy  . Rosuvastatin Other (See Comments)    11/2010  . Sudafed [Pseudoephedrine Hcl] Other (See Comments)    "Hyper"   . Sulfa Antibiotics Rash   Past Surgical History:  Procedure Laterality Date  . APPENDECTOMY    . CERVICAL DISC SURGERY     with cages and stem cell  . CHOLECYSTECTOMY    . INGUINAL HERNIA REPAIR Bilateral   . TONSILLECTOMY AND ADENOIDECTOMY     Family History  Problem Relation Age of Onset  . COPD Mother   . Atrial  fibrillation Mother   . Huntington's disease Mother   . Heart disease Father   . Lung cancer Maternal Grandmother   . Heart disease Paternal Grandmother        heart attack  . Heart disease Paternal Grandfather   . Heart disease Brother   . Alcohol abuse Brother   . Colon cancer Neg Hx   . Rectal cancer Neg Hx   . Stomach cancer Neg Hx  Social History   Socioeconomic History  . Marital status: Married    Spouse name: Lazariah Savard  . Number of children: 5  . Years of education: some college  . Highest education level: Some college, no degree  Social Needs  . Financial resource strain: Not hard at all  . Food insecurity - worry: Never true  . Food insecurity - inability: Never true  . Transportation needs - medical: No  . Transportation needs - non-medical: No  Occupational History  . Occupation: Stage manager: DISABLED     Comment: disabled since 2005 (spinal stenosis, PTSD, medication effects)  Tobacco Use  . Smoking status: Former Smoker    Types: Cigarettes    Last attempt to quit: 07/29/2004    Years since quitting: 12.9  . Smokeless tobacco: Never Used  Substance and Sexual Activity  . Alcohol use: No  . Drug use: Yes    Frequency: 5.0 times per week    Types: Marijuana    Comment: usually at night, for pain, "it really helps me."  . Sexual activity: Yes    Partners: Male    Birth control/protection: None  Other Topics Concern  . None  Social History Narrative   Lives with her husband (second marriage, first marriage ended in divorce), mother and oldest son.   Youngest daughter and granddaughter have moved back home as well.   Patient walks for exercise.   Depression screen [facility] 2/9 07/17/2017 05/17/2017 03/28/2017 02/28/2017 01/31/2017  Decreased Interest 3 0 0 0 1  Down, Depressed, Hopeless '3 3 3 3 1  ' PHQ - 2 Score '6 3 3 3 2  ' Altered sleeping '3 3 1 ' 0 1  Tired, decreased energy '3 3 2 3 1  ' Change in appetite '3 3 2 1 1  ' Feeling bad or failure  about yourself  '3 2 1 1 1  ' Trouble concentrating '3 3 3 ' 0 1  Moving slowly or fidgety/restless 0 3 0 0 1  Suicidal thoughts 0 0 0 - 1  PHQ-9 Score '21 20 12 8 9  ' Difficult doing work/chores Very difficult Extremely dIfficult Very difficult Extremely dIfficult Somewhat difficult    Review of Systems  Constitutional: Positive for appetite change and unexpected weight change.  Gastrointestinal: Positive for abdominal pain (intermittent) and nausea. Negative for vomiting.  Psychiatric/Behavioral: Positive for dysphoric mood.      Objective:   Physical Exam  Constitutional: She is oriented to person, place, and time. She appears well-developed and well-nourished. No distress.  HENT:  Head: Normocephalic and atraumatic.  Eyes: Conjunctivae and EOM are normal.  Neck: Neck supple. No tracheal deviation present.  Cardiovascular: Normal rate.  Pulmonary/Chest: Effort normal. No respiratory distress.  Abdominal: Bowel sounds are normal. There is tenderness in the right upper quadrant.  Musculoskeletal: Normal range of motion.  Neurological: She is alert and oriented to person, place, and time.  Skin: Skin is warm and dry.  Psychiatric: She has a normal mood and affect. Her behavior is normal.  Nursing note and vitals reviewed.  BP 110/76   Pulse 82   Temp 98.3 F (36.8 C) (Oral)   Resp 18   Ht '5\' 10"'  (1.778 m)   Wt 160 lb 12.8 oz (72.9 kg)   SpO2 96%   BMI 23.07 kg/m     Dg Chest 2 View  Result Date: 07/21/2017 CLINICAL DATA:  Chronic right upper abdominal and lower right chest pain. EXAM: CHEST  2 VIEW COMPARISON:  10/12/2010  radiographs FINDINGS: The cardiomediastinal silhouette is unremarkable. There is no evidence of focal airspace disease, pulmonary edema, suspicious pulmonary nodule/mass, pleural effusion, or pneumothorax. No acute bony abnormalities are identified. Anterior cervical fusion changes and cholecystectomy clips noted. IMPRESSION: No active cardiopulmonary disease.  Electronically Signed   By: Margarette Canada M.D.   On: 07/21/2017 12:49   EKG: NSR, no acute ischemic changes noted. No prior EKG available for comparison.   I have personally reviewed the EKG tracing and agree with the computer interpretation:  Sinus  Rhythm  -Short PR syndrome  PRi = 104 BORDERLINE RHYTHM  07/21/17 ESR 8 nml urinalysis and microscopy Assessment & Plan:   1. Abdominal pain, chronic, right upper quadrant - neg H. Pylori today. Check EKG, CXR, full lab panel as all of work-up was done prior to Epic so do not have any idea of extent or poss slightly abnml results. Start empiric trial of ppi x 4-6 wks taken 30 min before eating to r/u functional dyspepsia.Pt thought her sxs are due to fatty liver and [place] did show hepatic steatosis 2015 but CT 2016 stated liver text was nml other than mild extrahepatic biliary dilatation. Considering nml lipids, nml BMI, no metabolic syndrome picture seems to make fatty liver unlikely. I think she needs to be reimaged considering the mildly dilated ducts on prior CT - ensure they haven't worsened and reassess the prior abnml hepatic texture. . ..     2. Severe episode of recurrent major depressive disorder, without psychotic features ([place]) - not sure if sxs are any better on prozac 40 instead of amitriptyline 150 and is considering wanting to switch back - states amitriptyline was stopped because it wasn't safe longterm. . .   Cont prozac 40 and try adding back in just amitriptyline 50 - recheck w/ PCP in 6 wks.    3. PTSD (post-traumatic stress disorder) - refilled chronic valium x 1 mo - will not need refills until 09/01/17 - reminded pt she should only get these from PCP Jacqulynn Cadet - advised pt would be easier for her to make appt for this.   4. Other specified hypothyroidism - fatigue sxs worsening so pt would like full panel recheck today. FT3/FT4 nml though TSH mildy suppressed- cont levothyroxine 88 - refill x 3 mos whenever requested. Recheck tsh in  several mos at routine visit.   5. Chronic neck pain   6. Chronic midline low back pain without sciatica    ADDENDUM 07/22/17:  7. Idiopathic chronic pancreatitis (HCC) - no known etiology, lipids fine, no EtOH,  distant h/o cholecystectomy. Not on any red flag meds. Her RUQ sxs have been present for 15 years and are not currently flaired/worsened so very suprising to see elev lipase - needs recheck and further eval - check for chronic pancreatitis with imaging (unsure whether CT or MRI would be best so will start w/ CT) and fecal elastase.  Therapeutic trial of creon before every meal. Poss Sphincter of Odi dysfxn. F/u w/ GI Dr. Henrene Pastor asap - pt requests repeat referral for this be placed. Recheck w/ PCP this week as soon as CT and stool tests are done to review results and recheck serum amylase/lipase.  8. Elevated lipase -  9. Right upper quadrant pain     Orders Placed This Encounter  Procedures  . DG Chest 2 View    Standing Status:   Future    Number of Occurrences:   1    Standing Expiration Date:  07/21/2018    Order Specific Question:   Reason for Exam (SYMPTOM  OR DIAGNOSIS REQUIRED)    Answer:   chronic RUQ abd pain, r/o poss diaphragmatic irritation from right lung base    Order Specific Question:   Preferred imaging location?    Answer:   External  . CT Abdomen Pelvis Wo Contrast    Standing Status:   Future    Standing Expiration Date:   10/23/2018    Order Specific Question:   Preferred imaging location?    Answer:   [facility]    Order Specific Question:   Radiology Contrast Protocol - do NOT remove file path    Answer:   [email or web address]  . Lipase  . TSH+T4F+T3Free  . H. pylori breath test  . Comprehensive metabolic panel  . Protime-INR  . Gamma GT  . [id number]  . Comprehensive metabolic panel  . Lipase  . Pancreatic Elastase, Fecal    Standing Status:   Future    Standing Expiration Date:   07/23/2018  . POCT  urinalysis dipstick  . POCT Microscopic Urinalysis ([facility])  . POCT SEDIMENTATION RATE  . EKG 12-Lead    Meds ordered this encounter  Medications  . amitriptyline (ELAVIL) 50 MG tablet    Sig: Start 1/2 tab po qhs x 2 wks, then increase to 1 tab po qhs    Dispense:  90 tablet    Refill:  1  . omeprazole (PRILOSEC) 40 MG capsule    Sig: Take 1 capsule (40 mg total) by mouth daily. Every morning at least 30 minutes before eating    Dispense:  30 capsule    Refill:  3  . diazepam (VALIUM) 5 MG tablet    Sig: Take 1 tablet (5 mg total) by mouth every 6 (six) hours as needed. F/U with PCP Jacqulynn Cadet in office visit for additional refills.    Dispense:  120 tablet    Refill:  0  . lipase/protease/amylase (CREON) 36000 UNITS CPEP capsule    Sig: Take 1 capsule (36,000 Units total) by mouth 3 (three) times daily before meals.    Dispense:  90 capsule    Refill:  1   Over 45 min spent in face-to-face evaluation of and consultation with patient and coordination of care.  Over 50% of this time was spent counseling this patient regarding potential RUQ abd pain etiology and poss pancreatic abnml.  I personally performed the services described in this documentation, which was scribed in my presence. The recorded information has been reviewed and considered, and addended by me as needed.   Delman Cheadle, M.D.  Primary Care at [facility] [address] [place], [place] [address] [phone] phone [phone] fax  07/23/17 8:22 AM

## 2017-07-21 NOTE — Progress Notes (Signed)
Jjjjjjjjjjjjjjjjjjjjjjjjjjjl;;;;;;;;;;;;;;;;;;;;;;;;;;;;;;;;;;;;;;;;;;;;;;;;;;;;;;;;;;;;;;;;;;;;;;;;;;;;;;;;;;;;;;;;;;;;;;;;;;;;;;

## 2017-07-22 LAB — COMPREHENSIVE METABOLIC PANEL
ALT: 25 IU/L (ref 0–32)
AST: 21 IU/L (ref 0–40)
Albumin/Globulin Ratio: 1.9 (ref 1.2–2.2)
Albumin: 4.6 g/dL (ref 3.6–4.8)
Alkaline Phosphatase: 99 IU/L (ref 39–117)
BUN/Creatinine Ratio: 6 — ABNORMAL LOW (ref 12–28)
BUN: 6 mg/dL — ABNORMAL LOW (ref 8–27)
Bilirubin Total: 0.3 mg/dL (ref 0.0–1.2)
CO2: 25 mmol/L (ref 20–29)
Calcium: 9.6 mg/dL (ref 8.7–10.3)
Chloride: 103 mmol/L (ref 96–106)
Creatinine, Ser: 0.94 mg/dL (ref 0.57–1.00)
GFR calc Af Amer: 75 mL/min/{1.73_m2} (ref >59)
GFR calc non Af Amer: 65 mL/min/{1.73_m2} (ref >59)
Globulin, Total: 2.4 g/dL (ref 1.5–4.5)
Glucose: 100 mg/dL — ABNORMAL HIGH (ref 65–99)
Potassium: 4.7 mmol/L (ref 3.5–5.2)
Sodium: 141 mmol/L (ref 134–144)
Total Protein: 7 g/dL (ref 6.0–8.5)

## 2017-07-22 LAB — PROTIME-INR
INR: 1 (ref 0.8–1.2)
Prothrombin Time: 10.3 s (ref 9.1–12.0)

## 2017-07-22 LAB — LIPASE: Lipase: 289 U/L — ABNORMAL HIGH (ref 14–72)

## 2017-07-22 LAB — TSH+T4F+T3FREE
Free T4: 1.55 ng/dL (ref 0.82–1.77)
T3, Free: 2.9 pg/mL (ref 2.0–4.4)
TSH: 0.236 u[IU]/mL — ABNORMAL LOW (ref 0.450–4.500)

## 2017-07-22 LAB — GAMMA GT: GGT: 14 IU/L (ref 0–60)

## 2017-07-22 LAB — H. PYLORI BREATH TEST: H pylori Breath Test: NEGATIVE

## 2017-07-23 MED ORDER — PANCRELIPASE (LIP-PROT-AMYL) 36000-114000 UNITS PO CPEP: 36000.0000 [IU] | ORAL_CAPSULE | Freq: Three times a day (TID) | ORAL | 1 refills | Status: DC

## 2017-07-25 ENCOUNTER — Telehealth: Payer: Self-pay | Admitting: Family Medicine

## 2017-07-25 NOTE — Addendum Note (Signed)
Addended by: Suszanne Finch on: 07/25/2017 12:04 PM   Modules accepted: Orders

## 2017-07-25 NOTE — Telephone Encounter (Signed)
Called [facility] to let them know that pt CT order has been changed to correct order W CONTRAST.Marland Kitchen They stated that pt is already scheduled with them and there is no prior auth needed per her insurance and the reference # [id number]

## 2017-07-25 NOTE — Telephone Encounter (Signed)
Pt called and a note from [place] has been placed in order for CT Abdomen pelvis w/o contrast ordered on 07/23/17 stating this needed to be changed to a CT abdomen pelvis w/ contrast. This referral is marked as urgent so once orders are changed we need to call [facility] to let them know this has been changed. Thanks!

## 2017-07-26 DIAGNOSIS — R748 Abnormal levels of other serum enzymes: Secondary | ICD-10-CM | POA: Diagnosis not present

## 2017-07-26 DIAGNOSIS — K861 Other chronic pancreatitis: Secondary | ICD-10-CM | POA: Diagnosis not present

## 2017-07-26 NOTE — Addendum Note (Signed)
Addended by: Gari Crown D on: 07/26/2017 03:50 PM   Modules accepted: Orders

## 2017-07-27 ENCOUNTER — Ambulatory Visit
Admission: RE | Admit: 2017-07-27 | Discharge: 2017-07-27 | Disposition: A | Discharge status: Home / Self Care | Payer: Medicare Other | Source: Ambulatory Visit | Attending: Family Medicine | Admitting: Family Medicine

## 2017-07-27 DIAGNOSIS — R748 Abnormal levels of other serum enzymes: Secondary | ICD-10-CM

## 2017-07-27 DIAGNOSIS — K760 Fatty (change of) liver, not elsewhere classified: Secondary | ICD-10-CM | POA: Diagnosis not present

## 2017-07-27 DIAGNOSIS — G8929 Other chronic pain: Secondary | ICD-10-CM

## 2017-07-27 DIAGNOSIS — R1011 Right upper quadrant pain: Secondary | ICD-10-CM

## 2017-07-27 DIAGNOSIS — R140 Abdominal distension (gaseous): Secondary | ICD-10-CM

## 2017-07-27 DIAGNOSIS — K861 Other chronic pancreatitis: Secondary | ICD-10-CM

## 2017-07-27 MED ORDER — IOPAMIDOL (ISOVUE-300) INJECTION 61%
100.0000 mL | Freq: Once | INTRAVENOUS | Status: AC | PRN
  Administered 2017-07-27: 100 mL via INTRAVENOUS

## 2017-07-27 NOTE — Telephone Encounter (Signed)
Sign order. Imaging done today.

## 2017-07-28 ENCOUNTER — Encounter: Payer: Self-pay | Admitting: Family Medicine

## 2017-07-28 ENCOUNTER — Encounter: Payer: Self-pay | Admitting: Internal Medicine

## 2017-07-28 ENCOUNTER — Other Ambulatory Visit: Payer: Self-pay | Admitting: Physician Assistant

## 2017-07-28 ENCOUNTER — Ambulatory Visit (INDEPENDENT_AMBULATORY_CARE_PROVIDER_SITE_OTHER): Payer: Medicare Other | Admitting: Physician Assistant

## 2017-07-28 ENCOUNTER — Encounter: Payer: Self-pay | Admitting: Physician Assistant

## 2017-07-28 VITALS — BP 122/66 | HR 100 | Ht 70.0 in | Wt 157.0 lb

## 2017-07-28 DIAGNOSIS — R110 Nausea: Secondary | ICD-10-CM | POA: Diagnosis not present

## 2017-07-28 DIAGNOSIS — R1013 Epigastric pain: Secondary | ICD-10-CM

## 2017-07-28 DIAGNOSIS — K760 Fatty (change of) liver, not elsewhere classified: Secondary | ICD-10-CM

## 2017-07-28 DIAGNOSIS — R634 Abnormal weight loss: Secondary | ICD-10-CM

## 2017-07-28 LAB — PANCREATIC ELASTASE, FECAL

## 2017-07-28 MED ORDER — PROMETHAZINE HCL 25 MG PO TABS: 25.0000 mg | ORAL_TABLET | Freq: Three times a day (TID) | ORAL | 0 refills | Status: DC | PRN

## 2017-07-28 NOTE — Patient Instructions (Signed)
You have been scheduled for an endoscopy. Please follow written instructions given to you at your visit today. If you use inhalers (even only as needed), please bring them with you on the day of your procedure. Your physician has requested that you go to www.startemmi.com and enter the access code given to you at your visit today. This web site gives a general overview about your procedure. However, you should still follow specific instructions given to you by our office regarding your preparation for the procedure.  Continue Omeprazole.  Continue Phenergan.

## 2017-07-28 NOTE — Progress Notes (Signed)
Initial assessment and plans reviewed 

## 2017-07-28 NOTE — Progress Notes (Signed)
Chief Complaint: Right upper quadrant pain, nausea  HPI:    [name] is a 61 year old Caucasian female with a past medical history as listed below including Graves' disease and chronic right upper quadrant pain who follows with Dr. Henrene Pastor and returns to clinic today for a continued complaint of right upper quadrant pain and nausea.    She is a very poor historian and very tangential in her speaking.  Patient was last seen in clinic 03/25/15 by Dr. Henrene Pastor and at that time it was described that she was status post remote cholecystectomy.  She was initially evaluated 04/25/14 for a greater than 10-year history of chronic right upper quadrant pain exacerbated by certain movements.  The impression at that time was that this chronic right upper quadrant pain is likely musculoskeletal or possibly related to adhesions.  Patient underwent screening colonoscopy 07/09/14 which showed a diminutive polyp in the sigmoid colon.  Repeat was recommended in 5 years.  Patient had a CT scan performed 02/25/15.  This did not show a reason for her right upper quadrant pain either.  Labs obtained at that time including a CBC, TSH, CMP were all normal other than ALT of 57.  Apparently there was mild extrahepatic biliary dilatation postcholecystectomy which is similar to previous studies and incidental small umbilical hernia as well as a fatty liver.  At that time it was again discussed that her right upper quadrant pain was likely musculoskeletal versus adhesions, slow and steady weight loss was recommended for her fatty liver.    She had recent labs including a lipase which was elevated at 289.  H. pylori breath test was negative and a CMP was normal, PT/INR normal, C BC normal.  TSH was minimally decreased.  She also reported depression and not being able to eat.  She been tried on various antidepressants.    CT abdomen/ pelvis ordered on 07/27/17 which showed hepatic steatosis and aortic atherosclerosis.  Her pancreas was  normal.    Today, the patient presents to clinic accompanied by her daughter.  Again they are very poor historians and it is hard to follow the patient's story.  She has multiple very distinct questions regarding what is happening.  She continues to describe a right upper quadrant pain which has been chronic for her and she tells me this is likely related to "my surgery here before".       New for the patient she tells me that she has had increased bloating.  Patient tells me that she will sometimes appear 9 months pregnant.  Per her daughter this has been happening for years as well.  Patient cannot relate if this is after eating or in between.  Patient tells me she was started on Creon by her primary care provider on Saturday but she has forgotten this a couple of times.  She does not know if this is helping or not with her pain.  Patient also has associated nausea which makes her "not eat that much".  She denies any episodes of vomiting. Phenergan is helpful.    Patient will started on Omeprazole 40 mg daily on Saturday as well and tells me that she is not sure if this is helping or not.  Patient describes a distant EGD "years ago by the old Dr. Velora Heckler", but is unsure of what the findings were.     Currently she has been experiencing weight loss and is down about 27 pounds over the past 9 months.  She has been  experiencing fewer bowel movements but tells me this is because she is not eating.    Patient denies fever, chills, blood in her stool, melena, dysphasia, heartburn, reflux her symptoms awaken her at night.  Past Medical History:  Diagnosis Date  . Allergic rhinitis   . Allergy   . Anemia   . Anxiety   . Carpal tunnel syndrome   . Chronic prescription opiate use 05/20/2016   Due to DDD if the neck and lumbar spine  . Colon polyp    adenomatous  . Depression   . Gallstones   . Graves disease 2005   thyroid disease; treated with radioactive iodine  . Heart murmur    as a child  .  Hepatic steatosis   . Hyperlipidemia   . Inguinal hernia   . Osteoarthritis   . Osteoporosis   . Pneumonia   . PTSD (post-traumatic stress disorder)   . Toxic diffuse goiter   . Vertigo     Past Surgical History:  Procedure Laterality Date  . APPENDECTOMY    . CERVICAL DISC SURGERY     with cages and stem cell  . CHOLECYSTECTOMY    . INGUINAL HERNIA REPAIR Bilateral   . TONSILLECTOMY AND ADENOIDECTOMY      Current Outpatient Medications  Medication Sig Dispense Refill  . amitriptyline (ELAVIL) 50 MG tablet Start 1/2 tab po qhs x 2 wks, then increase to 1 tab po qhs 90 tablet 1  . aspirin 81 MG tablet Take 81 mg by mouth daily.    Marland Kitchen atorvastatin (LIPITOR) 80 MG tablet Take 1 tablet (80 mg total) by mouth daily. 90 tablet 3  . Calcium Carbonate (CALCIUM 600 PO) Take 1 capsule by mouth daily.    . cyclobenzaprine (FLEXERIL) 10 MG tablet Take 1 tablet (10 mg total) by mouth 3 (three) times daily as needed. for muscle spams 90 tablet 5  . [START ON 08/02/2017] diazepam (VALIUM) 5 MG tablet Take 1 tablet (5 mg total) by mouth every 6 (six) hours as needed. F/U with PCP Jacqulynn Cadet in office visit for additional refills. 120 tablet 0  . FIBER PO Take 1 tablet by mouth daily.    Marland Kitchen FLUoxetine (PROZAC) 40 MG capsule Take 1 capsule (40 mg total) by mouth daily. 90 capsule 3  . fluticasone (FLONASE) 50 MCG/ACT nasal spray Place 2 sprays into both nostrils daily. 16 g 12  . ketoconazole (NIZORAL) 2 % cream Apply 1 application topically daily. 30 g 0  . levothyroxine (SYNTHROID, LEVOTHROID) 88 MCG tablet TAKE 1 TABLET BY MOUTH DAILY BEFORE BREAKFAST 90 tablet 1  . lipase/protease/amylase (CREON) 36000 UNITS CPEP capsule Take 1 capsule (36,000 Units total) by mouth 3 (three) times daily before meals. 90 capsule 1  . mometasone (NASONEX) 50 MCG/ACT nasal spray INSTILL 2 SPRAYS IN EACH NOSTRIL DAILY 51 g 3  . Multiple Vitamin (MULTIVITAMIN) tablet Take 1 tablet by mouth daily.    Marland Kitchen omeprazole  (PRILOSEC) 40 MG capsule Take 1 capsule (40 mg total) by mouth daily. Every morning at least 30 minutes before eating 30 capsule 3  . ondansetron (ZOFRAN) 4 MG tablet Take 1 tablet (4 mg total) by mouth every 8 (eight) hours as needed for nausea or vomiting. 30 tablet 0  . promethazine (PHENERGAN) 25 MG tablet Take 1 tablet (25 mg total) by mouth every 8 (eight) hours as needed for nausea or vomiting. 20 tablet 0  . tobramycin (TOBREX) 0.3 % ophthalmic ointment Place 1 application into the left  eye 3 (three) times daily.    . vitamin E 400 UNIT capsule Take 400 Units by mouth daily.     No current facility-administered medications for this visit.     Allergies as of 07/28/2017 - Review Complete 07/28/2017  Allergen Reaction Noted  . Codeine Shortness Of Breath, Nausea And Vomiting, and Rash 10/25/2011  . Penicillins Anaphylaxis 10/25/2011  . Gabapentin  05/20/2016  . Morphine and related  10/25/2011  . Pravastatin Other (See Comments) 08/16/2016  . Prednisone Other (See Comments) 10/25/2011  . Rosuvastatin Other (See Comments) 08/16/2016  . Sudafed [pseudoephedrine hcl] Other (See Comments) 10/25/2011  . Sulfa antibiotics Rash 10/25/2011    Family History  Problem Relation Age of Onset  . COPD Mother   . Atrial fibrillation Mother   . Huntington's disease Mother   . Heart disease Father   . Lung cancer Maternal Grandmother   . Heart disease Paternal Grandmother        heart attack  . Heart disease Paternal Grandfather   . Heart disease Brother   . Alcohol abuse Brother   . Colon cancer Neg Hx   . Rectal cancer Neg Hx   . Stomach cancer Neg Hx     Social History   Socioeconomic History  . Marital status: Married    Spouse name: Baylie Drakes  . Number of children: 5  . Years of education: some college  . Highest education level: Some college, no degree  Social Needs  . Financial resource strain: Not hard at all  . Food insecurity - worry: Never true  . Food insecurity  - inability: Never true  . Transportation needs - medical: No  . Transportation needs - non-medical: No  Occupational History  . Occupation: Stage manager: DISABLED     Comment: disabled since 2005 (spinal stenosis, PTSD, medication effects)  Tobacco Use  . Smoking status: Former Smoker    Types: Cigarettes    Last attempt to quit: 07/29/2004    Years since quitting: 13.0  . Smokeless tobacco: Never Used  Substance and Sexual Activity  . Alcohol use: No  . Drug use: Yes    Frequency: 5.0 times per week    Types: Marijuana    Comment: usually at night, for pain, "it really helps me."  . Sexual activity: Yes    Partners: Male    Birth control/protection: None  Other Topics Concern  . Not on file  Social History Narrative   Lives with her husband (second marriage, first marriage ended in divorce), mother and oldest son.   Youngest daughter and granddaughter have moved back home as well.   Patient walks for exercise.    Review of Systems:    Constitutional: No fever or chills Cardiovascular: No chest pain Respiratory: No SOB  Gastrointestinal: See HPI and otherwise negative   Physical Exam:  Vital signs: BP 122/66   Pulse 100   Ht 5\' 10"  (1.778 m)   Wt 157 lb (71.2 kg)   BMI 22.53 kg/m   Constitutional:  Thin appearing Caucasian female appears to be in NAD, Well developed, Well nourished, alert and cooperative Respiratory: Respirations even and unlabored. Lungs clear to auscultation bilaterally.   No wheezes, crackles, or rhonchi.  Cardiovascular: Normal S1, S2. No MRG. Regular rate and rhythm. No peripheral edema, cyanosis or pallor.  Gastrointestinal:  Soft, nondistended, moderate epigastric and RUQ ttp, No rebound or guarding. Normal bowel sounds. No appreciable masses or hepatomegaly. Psychiatric: Demonstrates good judgement  and reason without abnormal affect or behaviors.  RELEVANT LABS AND IMAGING: CBC    Component Value Date/Time   WBC 6.2  01/31/2017 1157   WBC 6.4 08/19/2015 1337   RBC 4.65 01/31/2017 1157   RBC 4.75 08/19/2015 1337   HGB 13.9 01/31/2017 1157   HCT 41.3 01/31/2017 1157   PLT 299 01/31/2017 1157   MCV 89 01/31/2017 1157   MCH 29.9 01/31/2017 1157   MCH 30.1 08/19/2015 1337   MCHC 33.7 01/31/2017 1157   MCHC 32.6 08/19/2015 1337   RDW 13.9 01/31/2017 1157   LYMPHSABS 2.6 01/31/2017 1157   MONOABS 0.4 08/19/2015 1337   EOSABS 0.1 01/31/2017 1157   BASOSABS 0.0 01/31/2017 1157    CMP     Component Value Date/Time   NA 141 07/21/2017 1240   K 4.7 07/21/2017 1240   CL 103 07/21/2017 1240   CO2 25 07/21/2017 1240   GLUCOSE 100 (H) 07/21/2017 1240   GLUCOSE 107 (H) 04/19/2016 1741   BUN 6 (L) 07/21/2017 1240   CREATININE 0.94 07/21/2017 1240   CREATININE 1.08 (H) 04/19/2016 1741   CALCIUM 9.6 07/21/2017 1240   PROT 7.0 07/21/2017 1240   ALBUMIN 4.6 07/21/2017 1240   AST 21 07/21/2017 1240   ALT 25 07/21/2017 1240   ALKPHOS 99 07/21/2017 1240   BILITOT 0.3 07/21/2017 1240   GFRNONAA 65 07/21/2017 1240   GFRNONAA 64 02/12/2014 1124   GFRAA 75 07/21/2017 1240   GFRAA 74 02/12/2014 1124   EXAM: CT ABDOMEN AND PELVIS WITH CONTRAST 07/27/17  TECHNIQUE: Multidetector CT imaging of the abdomen and pelvis was performed using the standard protocol following bolus administration of intravenous contrast.  CONTRAST:  129mL ISOVUE-300 IOPAMIDOL (ISOVUE-300) INJECTION 61%  COMPARISON:  02/25/2015  FINDINGS: Lower chest: No acute abnormality.  Hepatobiliary: No focal liver abnormality is seen. Relative attenuation of the liver compared with the spleen as can be seen with hepatic steatosis. Status post cholecystectomy. No biliary dilatation.  Pancreas: Unremarkable. No pancreatic ductal dilatation or surrounding inflammatory changes.  Spleen: Normal in size without focal abnormality.  Adrenals/Urinary Tract: Adrenal glands are unremarkable. Kidneys are normal, without renal calculi,  focal lesion, or hydronephrosis. Bladder is unremarkable.  Stomach/Bowel: Stomach is within normal limits. Appendix appears normal. No evidence of bowel wall thickening, distention, or inflammatory changes.  Vascular/Lymphatic: Aortic atherosclerosis. No enlarged abdominal or pelvic lymph nodes.  Reproductive: Uterus and bilateral adnexa are unremarkable.  Other: No abdominal wall hernia or abnormality. No abdominopelvic ascites.  Musculoskeletal: No acute or significant osseous findings. Degenerative changes of the pubic symphysis. Degenerative disc disease with disc height loss at L5-S1 with bilateral facet arthropathy.  IMPRESSION: 1. Hepatic steatosis. 2.  Aortic Atherosclerosis (ICD10-170.0)   Electronically Signed   By: Kathreen Devoid   On: 07/27/2017 10:  Assessment: 1.  Epigastric/right upper quadrant pain: Worse over the past few months, H. pylori breath testing negative; consider gastritis versus PUD versus other  2.  Nausea: With the above 3.  Weight loss: 27 pounds in the past month; relation to depression and antidepressants being tried versus decreased appetite due to epigastric pain nausea above 4.  Hepatic steatosis: Again seen at time of CT as above  Plan: 1.  Scheduled patient for an EGD in the [place] with Dr. Henrene Pastor.  Did discuss risk, benefits, limitations and alternatives and the patient agrees to proceed. 2.  Patient was continued on Omeprazole 40 mg daily and Creon supplementation. 3.  Fecal pancreatic elastase is pending.  4.  Recommend the patient supplement her diet with Ensure/Boost or Carnation breakfast shakes to maintain her weight 5.  Patient did ask about Mirtazapine.  Would recommend holding off on this for now until that there is nothing going on in her stomach which is giving her symptoms. 6.  Patient to follow in clinic per recommendations from Dr. Henrene Pastor after time of procedure.  Amber Newer, PA-C [facility] 07/28/2017, 1:33 PM  Cc: Shawnee Knapp, MD

## 2017-07-29 ENCOUNTER — Other Ambulatory Visit: Payer: Self-pay | Admitting: Family Medicine

## 2017-07-29 DIAGNOSIS — R748 Abnormal levels of other serum enzymes: Secondary | ICD-10-CM

## 2017-07-31 ENCOUNTER — Ambulatory Visit (INDEPENDENT_AMBULATORY_CARE_PROVIDER_SITE_OTHER): Payer: Medicare Other | Admitting: Family Medicine

## 2017-07-31 DIAGNOSIS — R748 Abnormal levels of other serum enzymes: Secondary | ICD-10-CM

## 2017-08-01 ENCOUNTER — Encounter: Payer: Self-pay | Admitting: Family Medicine

## 2017-08-01 NOTE — Progress Notes (Signed)
Patient ID: [name], female   DOB: [birth year], 61 y.o.   MRN: [id number] DDX: Suspect isolated lipase was lab error. However, might be worth rechecking lipase/amylase once more time DURING a time of active SEVERE sxs just to ensure this isn't a very atypical pancreatic sphincter of Oddi dysfxn but unlikely as no pancreatic ductal dilatation on CT. No biliary dilitation on 07/27/17 CT either (or 9/15 [place]) though mild prominence was seen on the 01/2015 CT (as can pnysiologically occur after cholecystectomy and with aging).  - Consider therapeutic trial of high dose sulcralfate or rxing a GI cocktail if pt has had any small improvement with ppi or EGD shows poss gastritis/duodenitis.  - If more cramp like - consider trial of triple coated peppermint oil ED 180mg  tid to aid in relaxation of the upper GI tract smooth muscle.  If this is etiology of pain, starting ppi may WORSEN sxs - can speed up GI tract.  AND/OR - doesn't appear that pt has had trial of dicyclomine/hyoscyamine. . .  - Consider referral back to gen surgery to see if they think there would be any role for ex lap with poss LOA - pt has had appendectomy and cholecystectomy (not sure if done in same surg) and B ing hernia repair.     - If abd pain started AFTER an abd surgery - cons the poss of the "somatosensory hypersensitivity following cholecystectomy" (in which case could hypothesis that any additional surgical intervention for ex lap or LOA could potentially worsen sxs . . .  Not sure how to treat this beyond the TCA/cymbalta/anti-epileptic pain meds - wonder if this could respond to more therapeutic physical interventions like acupunture, myofascial release, craniosacral therapy. . ..  - Trial of nifedipine - not sure how it would help but very low risk to try except we will be limited by Gabriana's excellent baseline BP so might not be able to tolerate a therapeutic dose w/o orthostasis/fatigue.  - Apparently in the absence of the  gallbladder and exclusion of dyspepsia/IBS, checking a quantitative hepatobiliary scintigraphy can sometimes be a helpful part of the puzzle but lots of overlap with nml and abnml w/ other dysfxn as well as interpretation/scoring of the scan results can be quite variable (esp in areas like [place] where radiologists do not see these freq) potentially leading to very poor sensitivity (~=/>25%!) and specificity (~75%).  Above from excellent UTD article: Clinical manifestations and diagnosis of Sphincter of Oddi dysfunction --> Diagnostic approach --> Diagnosis of biliary SOD            ?Normal liver tests and bile duct - Symptoms in patients with biliary type pain and normal liver tests and bile duct may be due to functional biliary type pain or other functional bowel diseases, such as dyspepsia or irritable bowel syndrome. To further complicate matters, these conditions may coexist and may result from generalized smooth muscle dysfunction, duodenal hyperalgesia [25], or somatosensory hypersensitivity following cholecystectomy [26].  Our approach begins with an evaluation focused on detecting clinical features associated with irritable bowel syndrome or functional dyspepsia (algorithm 1). Patients whose clinical features are more consistent with one of these diagnoses are treated accordingly. (See "Clinical manifestations and diagnosis of irritable bowel syndrome in adults" and "Approach to the adult with dyspepsia".) Patients with biliary pain alone [and who are s/p cholecystectomy] . . . are commonly encountered and represent a difficult diagnostic and management challenge [27]. In such patients, pharmacologic trials should be tried (eg, proton pump inhibitors,  spasmolytic drugs, calcium channel blockers [nifedipine], and psychotropic agents).

## 2017-08-02 ENCOUNTER — Telehealth: Payer: Self-pay

## 2017-08-02 LAB — AMYLASE: Amylase: 48 U/L (ref 31–124)

## 2017-08-02 LAB — LIPASE: Lipase: 28 U/L (ref 14–72)

## 2017-08-02 NOTE — Telephone Encounter (Signed)
Copied from [place] [id number]. Topic: Inquiry >> Aug 02, 2017  9:40 AM Corie Chiquito, NT wrote: Reason for CRM: Joe from [employer] called to speak with a Gari Crown about this patient. If she could please give him a call back at [phone]

## 2017-08-03 ENCOUNTER — Ambulatory Visit (AMBULATORY_SURGERY_CENTER): Payer: Medicare Other | Admitting: Internal Medicine

## 2017-08-03 ENCOUNTER — Other Ambulatory Visit: Payer: Self-pay

## 2017-08-03 ENCOUNTER — Encounter: Payer: Self-pay | Admitting: Internal Medicine

## 2017-08-03 VITALS — BP 116/83 | HR 70 | Temp 97.5°F | Resp 12 | Ht 70.0 in | Wt 157.0 lb

## 2017-08-03 DIAGNOSIS — R634 Abnormal weight loss: Secondary | ICD-10-CM

## 2017-08-03 DIAGNOSIS — R109 Unspecified abdominal pain: Secondary | ICD-10-CM | POA: Diagnosis not present

## 2017-08-03 DIAGNOSIS — R110 Nausea: Secondary | ICD-10-CM | POA: Diagnosis not present

## 2017-08-03 DIAGNOSIS — R1011 Right upper quadrant pain: Secondary | ICD-10-CM | POA: Diagnosis present

## 2017-08-03 DIAGNOSIS — R1013 Epigastric pain: Secondary | ICD-10-CM

## 2017-08-03 MED ORDER — SODIUM CHLORIDE 0.9 % IV SOLN: 500.0000 mL | Freq: Once | INTRAVENOUS | Status: DC

## 2017-08-03 NOTE — Patient Instructions (Signed)
YOU HAD AN ENDOSCOPIC PROCEDURE TODAY: Refer to the procedure report and other information in the discharge instructions given to you for any specific questions about what was found during the examination. If this information does not answer your questions, please call [place] office at [phone] to clarify.   YOU SHOULD EXPECT: Some feelings of bloating in the abdomen. Passage of more gas than usual. Walking can help get rid of the air that was put into your GI tract during the procedure and reduce the bloating. If you had a lower endoscopy (such as a colonoscopy or flexible sigmoidoscopy) you may notice spotting of blood in your stool or on the toilet paper. Some abdominal soreness may be present for a day or two, also.  DIET: Your first meal following the procedure should be a light meal and then it is ok to progress to your normal diet. A half-sandwich or bowl of soup is an example of a good first meal. Heavy or fried foods are harder to digest and may make you feel nauseous or bloated. Drink plenty of fluids but you should avoid alcoholic beverages for 24 hours. If you had a esophageal dilation, please see attached instructions for diet.    ACTIVITY: Your care partner should take you home directly after the procedure. You should plan to take it easy, moving slowly for the rest of the day. You can resume normal activity the day after the procedure however YOU SHOULD NOT DRIVE, use power tools, machinery or perform tasks that involve climbing or major physical exertion for 24 hours (because of the sedation medicines used during the test).   SYMPTOMS TO REPORT IMMEDIATELY: A gastroenterologist can be reached at any hour. Please call [phone]  for any of the following symptoms:   Following upper endoscopy (EGD, EUS, ERCP, esophageal dilation) Vomiting of blood or coffee ground material  New, significant abdominal pain  New, significant chest pain or pain under the shoulder blades  Painful or  persistently difficult swallowing  New shortness of breath  Black, tarry-looking or red, bloody stools  FOLLOW UP:  If any biopsies were taken you will be contacted by phone or by letter within the next 1-3 weeks. Call [phone]  if you have not heard about the biopsies in 3 weeks.  Please also call with any specific questions about appointments or follow up tests.  

## 2017-08-03 NOTE — Progress Notes (Signed)
A and O x3. Report to RN. Tolerated MAC anesthesia well.Teethand gums  unchanged after procedure. 

## 2017-08-03 NOTE — Op Note (Signed)
[name] Patient Name: [name] Procedure Date: 08/03/2017 10:15 AM MRN: [id number] Endoscopist: Docia Chuck. [name] , MD Age: 61 Referring MD:  Date of Birth: 1956-04-28 Gender: Female Account #: [id number] Procedure:                Upper GI endoscopy Indications:              Epigastric abdominal pain, Abdominal pain in the                            right upper quadrant Medicines:                Monitored Anesthesia Care Procedure:                Pre-Anesthesia Assessment:                           - Prior to the procedure, a History and Physical                            was performed, and patient medications and                            allergies were reviewed. The patient's tolerance of                            previous anesthesia was also reviewed. The risks                            and benefits of the procedure and the sedation                            options and risks were discussed with the patient.                            All questions were answered, and informed consent                            was obtained. Prior Anticoagulants: The patient has                            taken no previous anticoagulant or antiplatelet                            agents. ASA Grade Assessment: II - A patient with                            mild systemic disease. After reviewing the risks                            and benefits, the patient was deemed in                            satisfactory condition to undergo the procedure.  After obtaining informed consent, the endoscope was                            passed under direct vision. Throughout the                            procedure, the patient's blood pressure, pulse, and                            oxygen saturations were monitored continuously. The                            Endoscope was introduced through the mouth, and                            advanced to the second part of  duodenum. The upper                            GI endoscopy was accomplished without difficulty.                            The patient tolerated the procedure well. Scope In: Scope Out: Findings:                 The esophagus was normal.                           The stomach was normal.                           The examined duodenum was normal.                           The cardia and gastric fundus were normal on                            retroflexion. Complications:            No immediate complications. Estimated Blood Loss:     Estimated blood loss: none. Impression:               - Normal EGD                           - Chronic right upper quadrant pain felt to be                            musculoskeletal, adhesions, or functional                           - Return to the care of Dr. Brigitte Pulse Recommendation:           - Patient has a contact number available for                            emergencies. The signs and symptoms of potential  delayed complications were discussed with the                            patient. Return to normal activities tomorrow.                            Written discharge instructions were provided to the                            patient.                           - Resume previous diet.                           - Continue present medications. Docia Chuck. Amber Pastor, MD 08/03/2017 10:40:37 AM This report has been signed electronically.

## 2017-08-04 ENCOUNTER — Telehealth: Payer: Self-pay | Admitting: *Deleted

## 2017-08-04 NOTE — Telephone Encounter (Signed)
  Follow up Call-  Call back number 08/03/2017  Post procedure Call Back phone  # [phone]  Permission to leave phone message Yes  Some recent data might be hidden     Patient questions:  Do you have a fever, pain , or abdominal swelling? No. Pain Score  0 *  Have you tolerated food without any problems? Yes.    Have you been able to return to your normal activities? Yes.    Do you have any questions about your discharge instructions: Diet   No. Medications  No. Follow up visit  no  Do you have questions or concerns about your Care? No.  Actions: * If pain score is 4 or above: No action needed, pain <4.  Patient c/o sore throat.  I explained to her that this is normal.  She will call back if symptoms do not improve.

## 2017-08-05 ENCOUNTER — Ambulatory Visit: Payer: Medicare Other | Admitting: Family Medicine

## 2017-08-07 NOTE — Progress Notes (Signed)
Not seen by provider 

## 2017-08-10 ENCOUNTER — Ambulatory Visit: Payer: Medicare Other | Admitting: Family Medicine

## 2017-08-14 ENCOUNTER — Ambulatory Visit (INDEPENDENT_AMBULATORY_CARE_PROVIDER_SITE_OTHER): Payer: Medicare Other | Admitting: Family Medicine

## 2017-08-14 ENCOUNTER — Encounter: Payer: Self-pay | Admitting: Family Medicine

## 2017-08-14 ENCOUNTER — Other Ambulatory Visit: Payer: Self-pay

## 2017-08-14 VITALS — BP 114/72 | HR 98 | Temp 98.5°F | Resp 16 | Ht 69.0 in | Wt 156.4 lb

## 2017-08-14 DIAGNOSIS — M542 Cervicalgia: Secondary | ICD-10-CM | POA: Diagnosis not present

## 2017-08-14 DIAGNOSIS — M545 Low back pain: Secondary | ICD-10-CM

## 2017-08-14 DIAGNOSIS — B370 Candidal stomatitis: Secondary | ICD-10-CM | POA: Diagnosis not present

## 2017-08-14 DIAGNOSIS — G8929 Other chronic pain: Secondary | ICD-10-CM

## 2017-08-14 DIAGNOSIS — F332 Major depressive disorder, recurrent severe without psychotic features: Secondary | ICD-10-CM | POA: Diagnosis not present

## 2017-08-14 DIAGNOSIS — R748 Abnormal levels of other serum enzymes: Secondary | ICD-10-CM | POA: Diagnosis not present

## 2017-08-14 DIAGNOSIS — J029 Acute pharyngitis, unspecified: Secondary | ICD-10-CM | POA: Diagnosis not present

## 2017-08-14 DIAGNOSIS — B3781 Candidal esophagitis: Secondary | ICD-10-CM

## 2017-08-14 DIAGNOSIS — F4310 Post-traumatic stress disorder, unspecified: Secondary | ICD-10-CM | POA: Diagnosis not present

## 2017-08-14 DIAGNOSIS — R1011 Right upper quadrant pain: Secondary | ICD-10-CM

## 2017-08-14 LAB — POCT RAPID STREP A (OFFICE): RAPID STREP A SCREEN: NEGATIVE

## 2017-08-14 LAB — POCT SKIN KOH: Skin KOH, POC: POSITIVE — AB

## 2017-08-14 MED ORDER — NYSTATIN 100000 UNIT/ML MT SUSP: 5.0000 mL | Freq: Four times a day (QID) | OROMUCOSAL | 0 refills | Status: DC

## 2017-08-14 NOTE — Progress Notes (Signed)
Subjective:    Patient ID: [name], female    DOB: 1956/07/09, 61 y.o.   MRN: [id number] Chief Complaint  Patient presents with  . Follow-up    Endoscopy  . sore throat    from endoscopy, "feels like she is losing voice", can talk better today    HPI Her throat was sore after the endoscopy but then became hoarse about a week ago. No dysphargia. Felt a little sick but not bad. No f/c. No sinus pressure/apin. A little cough - she did get a little mucous up this a.m.  No gerd/indigestion, tongue fine. She has been trying honey and peppermint, vit C.   Past Medical History:  Diagnosis Date  . Allergic rhinitis   . Allergy   . Anemia   . Anxiety   . Carpal tunnel syndrome   . Chronic prescription opiate use 05/20/2016   Due to DDD if the neck and lumbar spine  . Colon polyp    adenomatous  . Depression   . Gallstones   . Graves disease 2005   thyroid disease; treated with radioactive iodine  . Heart murmur    as a child  . Hepatic steatosis   . Hyperlipidemia   . Inguinal hernia   . Osteoarthritis   . Osteoporosis   . Pneumonia   . PTSD (post-traumatic stress disorder)   . Toxic diffuse goiter   . Vertigo    Past Surgical History:  Procedure Laterality Date  . APPENDECTOMY    . CERVICAL DISC SURGERY     with cages and stem cell  . CHOLECYSTECTOMY    . INGUINAL HERNIA REPAIR Bilateral   . TONSILLECTOMY AND ADENOIDECTOMY     Current Outpatient Medications on File Prior to Visit  Medication Sig Dispense Refill  . amitriptyline (ELAVIL) 50 MG tablet Start 1/2 tab po qhs x 2 wks, then increase to 1 tab po qhs 90 tablet 1  . aspirin 81 MG tablet Take 81 mg by mouth daily.    Marland Kitchen atorvastatin (LIPITOR) 80 MG tablet Take 1 tablet (80 mg total) by mouth daily. 90 tablet 3  . Calcium Carbonate (CALCIUM 600 PO) Take 1 capsule by mouth daily.    . cyclobenzaprine (FLEXERIL) 10 MG tablet Take 1 tablet (10 mg total) by mouth 3 (three) times daily as needed. for muscle  spams 90 tablet 5  . diazepam (VALIUM) 5 MG tablet Take 1 tablet (5 mg total) by mouth every 6 (six) hours as needed. F/U with PCP Jacqulynn Cadet in office visit for additional refills. 120 tablet 0  . FIBER PO Take 1 tablet by mouth daily.    Marland Kitchen FLUoxetine (PROZAC) 40 MG capsule Take 1 capsule (40 mg total) by mouth daily. 90 capsule 3  . fluticasone (FLONASE) 50 MCG/ACT nasal spray Place 2 sprays into both nostrils daily. 16 g 12  . ketoconazole (NIZORAL) 2 % cream Apply 1 application topically daily. 30 g 0  . levothyroxine (SYNTHROID, LEVOTHROID) 88 MCG tablet TAKE 1 TABLET BY MOUTH DAILY BEFORE BREAKFAST 90 tablet 1  . mometasone (NASONEX) 50 MCG/ACT nasal spray INSTILL 2 SPRAYS IN EACH NOSTRIL DAILY 51 g 3  . Multiple Vitamin (MULTIVITAMIN) tablet Take 1 tablet by mouth daily.    Marland Kitchen omeprazole (PRILOSEC) 40 MG capsule Take 1 capsule (40 mg total) by mouth daily. Every morning at least 30 minutes before eating 30 capsule 3  . promethazine (PHENERGAN) 25 MG tablet Take 1 tablet (25 mg total) by mouth every 8 (  eight) hours as needed for nausea or vomiting. 20 tablet 0  . vitamin E 400 UNIT capsule Take 400 Units by mouth daily.    . ondansetron (ZOFRAN) 4 MG tablet Take 1 tablet (4 mg total) by mouth every 8 (eight) hours as needed for nausea or vomiting. (Patient not taking: Reported on 08/14/2017) 30 tablet 0  . tobramycin (TOBREX) 0.3 % ophthalmic ointment Place 1 application into the left eye 3 (three) times daily.     Current Facility-Administered Medications on File Prior to Visit  Medication Dose Route Frequency Provider Last Rate Last Dose  . 0.9 %  sodium chloride infusion  500 mL Intravenous Once Irene Shipper, MD       Allergies  Allergen Reactions  . Codeine Shortness Of Breath, Nausea And Vomiting and Rash  . Penicillins Anaphylaxis  . Gabapentin     Didn't work, and made her feel really bad  . Morphine And Related   . Pravastatin Other (See Comments)    Chest wall and RUQ abdominal  pain 05/2009  . Prednisone Other (See Comments)    Dizzy  . Rosuvastatin Other (See Comments)    11/2010  . Sudafed [Pseudoephedrine Hcl] Other (See Comments)    "Hyper"   . Sulfa Antibiotics Rash   Family History  Problem Relation Age of Onset  . COPD Mother   . Atrial fibrillation Mother   . Huntington's disease Mother   . Heart disease Father   . Lung cancer Maternal Grandmother   . Heart disease Paternal Grandmother        heart attack  . Heart disease Paternal Grandfather   . Heart disease Brother   . Alcohol abuse Brother   . Colon cancer Neg Hx   . Rectal cancer Neg Hx   . Stomach cancer Neg Hx    Social History   Socioeconomic History  . Marital status: Married    Spouse name: Tyria Springer  . Number of children: 5  . Years of education: some college  . Highest education level: Some college, no degree  Social Needs  . Financial resource strain: Not hard at all  . Food insecurity - worry: Never true  . Food insecurity - inability: Never true  . Transportation needs - medical: No  . Transportation needs - non-medical: No  Occupational History  . Occupation: Stage manager: DISABLED     Comment: disabled since 2005 (spinal stenosis, PTSD, medication effects)  Tobacco Use  . Smoking status: Former Smoker    Types: Cigarettes    Last attempt to quit: 07/29/2004    Years since quitting: 13.0  . Smokeless tobacco: Never Used  Substance and Sexual Activity  . Alcohol use: No  . Drug use: Yes    Frequency: 5.0 times per week    Types: Marijuana    Comment: used again last night for pain relief/1900  . Sexual activity: Yes    Partners: Male    Birth control/protection: None  Other Topics Concern  . None  Social History Narrative   Lives with her husband (second marriage, first marriage ended in divorce), mother and oldest son.   Youngest daughter and granddaughter have moved back home as well.   Patient walks for exercise.   Depression  screen [facility] 2/9 08/14/2017 07/17/2017 05/17/2017 03/28/2017 02/28/2017  Decreased Interest 0 3 0 0 0  Down, Depressed, Hopeless 0 3 3 3 3   PHQ - 2 Score 0 6 3 3  3  Altered sleeping - 3 3 1  0  Tired, decreased energy - 3 3 2 3   Change in appetite - 3 3 2 1   Feeling bad or failure about yourself  - 3 2 1 1   Trouble concentrating - 3 3 3  0  Moving slowly or fidgety/restless - 0 3 0 0  Suicidal thoughts - 0 0 0 -  PHQ-9 Score - 21 20 12 8   Difficult doing work/chores - Very difficult Extremely dIfficult Very difficult Extremely dIfficult     Review of Systems See hpi    Objective:   Physical Exam  Constitutional: She is oriented to person, place, and time. She appears well-developed and well-nourished. No distress.  HENT:  Head: Normocephalic and atraumatic.  Right Ear: Tympanic membrane, external ear and ear canal normal.  Left Ear: Tympanic membrane, external ear and ear canal normal.  Nose: Nose normal. No mucosal edema or rhinorrhea.  Mouth/Throat: Uvula is midline. Mucous membranes are dry. Posterior oropharyngeal erythema present. No oropharyngeal exudate or posterior oropharyngeal edema.  Thick white coating adhering to tongue  Eyes: Conjunctivae are normal. Right eye exhibits no discharge. Left eye exhibits no discharge. No scleral icterus.  Neck: Normal range of motion. Neck supple.  Cardiovascular: Normal rate, regular rhythm, normal heart sounds and intact distal pulses.  Pulmonary/Chest: Effort normal and breath sounds normal.  Abdominal: Soft. Bowel sounds are normal. She exhibits no distension and no mass. There is no tenderness. There is no rebound and no guarding.  Lymphadenopathy:    She has no cervical adenopathy.  Neurological: She is alert and oriented to person, place, and time.  Skin: Skin is warm and dry. She is not diaphoretic. No erythema.  Psychiatric: She has a normal mood and affect. Her behavior is normal.      BP 114/72   Pulse (!) 107   Temp 98.5 F  (36.9 C) (Oral)   Resp 16   Ht 5\' 9"  (1.753 m)   Wt 156 lb 6.4 oz (70.9 kg)   SpO2 97%   BMI 23.10 kg/m   Results for orders placed or performed in visit on 08/14/17  POCT rapid strep A  Result Value Ref Range   Rapid Strep A Screen Negative Negative  POCT Skin KOH  Result Value Ref Range   Skin KOH, POC Positive (A) Negative  This was a scraping taken from her tongue.  Assessment & Plan:   1. Acute pharyngitis, unspecified etiology  - due to thrush  2. Abnormal serum lipase level   3. Right upper quadrant pain   4. Thrush of mouth and esophagus ([place])   5.      Anxiety - on valium 5mg  qid from PCP [facility] - sent in another 1 mo refill to be filled on 09/02/2016 - needs to f/u in [place] w/ PCP Jeffery for any additional refills.  Orders Placed This Encounter  Procedures  . Culture, Group A Strep    Order Specific Question:   Source    Answer:   oropharynx  . Lipase  . Amylase  . POCT rapid strep A  . POCT Skin KOH    Meds ordered this encounter  Medications  . nystatin (MYCOSTATIN) 100000 UNIT/ML suspension    Sig: Take 5 mLs (500,000 Units total) by mouth 4 (four) times daily.    Dispense:  200 mL    Refill:  0    Delman Cheadle, M.D.  Primary Care at [facility] [place], [place]  [phone] phone [phone] fax  08/20/17 6:23 PM

## 2017-08-14 NOTE — Patient Instructions (Addendum)
Hoarseness Hoarseness is any abnormal change in your voice.Hoarseness can make it difficult to speak. Your voice may sound raspy, breathy, or strained. Hoarseness is caused by a problem with the vocal cords. The vocal cords are two bands of tissue inside your voice box (larynx). When you speak, your vocal cords move back and forth to create sound. The surfaces of your vocal cords need to be smooth for your voice to sound clear. Swelling or lumps on the vocal cords can cause hoarseness. Common causes of vocal cord problems include:  Upper airway infection.  A long-term cough.  Straining or overusing your voice.  Smoking.  Allergies.  Vocal cord growths.  Stomach acids that flow up from your stomach and irritate your vocal cords (gastroesophageal reflux).  Follow these instructions at home: Watch your condition for any changes. To ease any discomfort that you feel:  Rest your voice. Do not whisper. Whispering can cause muscle strain.  Do not speak in a loud or harsh voice that makes your hoarseness worse.  Do not use any tobacco products, including cigarettes, chewing tobacco, or electronic cigarettes. If you need help quitting, ask your health care provider.  Avoid secondhand smoke.  Do not eat foods that give you heartburn. Heartburn can make gastroesophageal reflux worse.  Do not drink coffee.  Do not drink alcohol.  Drink enough fluids to keep your urine clear or pale yellow.  Use a humidifier if the air in your home is dry.  Contact a health care provider if:  You have hoarseness that lasts longer than 3 weeks.  You almost lose or completelylose your voice for longer than 3 days.  You have pain when you swallow or try to talk.  You feel a lump in your neck. Get help right away if:  You have trouble swallowing.  You feel as though you are choking when you swallow.  You cough up blood or vomit blood.  You have trouble breathing. This information is not  intended to replace advice given to you by your health care provider. Make sure you discuss any questions you have with your health care provider. Document Released: 07/29/2005 Document Revised: 01/21/2016 Document Reviewed: 08/06/2014 Elsevier Interactive Patient Education  2018 [place], Adult Oral thrush, also called oral candidiasis, is a fungal infection that develops in the mouth and throat and on the tongue. It causes white patches to form on the mouth and tongue. Ritta Slot is most common in older adults, but it can occur at any age. Many cases of thrush are mild, but this infection can also be serious. Ritta Slot can be a repeated (recurrent) problem for certain people who have a weak body defense system (immune system). The weakness can be caused by chronic illnesses, or by taking medicines that limit the body's ability to fight infection. If a person has difficulty fighting infection, the fungus that causes thrush can spread through the body. This can cause life-threatening blood or organ infections. What are the causes? This condition is caused by a fungus (yeast) called Candida albicans.  This fungus is normally present in small amounts in the mouth and on other mucous membranes. It usually causes no harm.  If conditions are present that allow the fungus to grow without control, it invades surrounding tissues and becomes an infection.  Other Candida species can also lead to thrush (rare).  What increases the risk? This condition is more likely to develop in:  People with a weakened immune system.  Older  adults.  People with HIV (human immunodeficiency virus).  People with diabetes.  People with dry mouth (xerostomia).  Pregnant women.  People with poor dental care, especially people who have false teeth.  People who use antibiotic medicines.  What are the signs or symptoms? Symptoms of this condition can vary from mild and moderate to severe and persistent.  Symptoms may include:  A burning feeling in the mouth and throat. This can occur at the start of a thrush infection.  White patches that stick to the mouth and tongue. The tissue around the patches may be red, raw, and painful. If rubbed (during tooth brushing, for example), the patches and the tissue of the mouth may bleed easily.  A bad taste in the mouth or difficulty tasting foods.  A cottony feeling in the mouth.  Pain during eating and swallowing.  Poor appetite.  Cracking at the corners of the mouth.  How is this diagnosed? This condition is diagnosed based on:  Physical exam. Your health care provider will look in your mouth.  Health history. Your health care provider will ask you questions about your health.  How is this treated? This condition is treated with medicines called antifungals, which prevent the growth of fungi. These medicines are either applied directly to the affected area (topical) or swallowed (oral). The treatment will depend on the severity of the condition. Mild thrush Mild cases of thrush may clear up with the use of an antifungal mouth rinse or lozenges. Treatment usually lasts about 14 days. Moderate to severe thrush  More severe thrush infections that have spread to the esophagus are treated with an oral antifungal medicine. A topical antifungal medicine may also be used.  For some severe infections, treatment may need to continue for more than 14 days.  Oral antifungal medicines are rarely used during pregnancy because they may be harmful to the unborn child. If you are pregnant, talk with your health care provider about options for treatment. Persistent or recurrent thrush For cases of thrush that do not go away or keep coming back:  Treatment may be needed twice as long as the symptoms last.  Treatment will include both oral and topical antifungal medicines.  People with a weakened immune system can take an antifungal medicine on a  continuous basis to prevent thrush infections.  It is important to treat conditions that make a person more likely to get thrush, such as diabetes or HIV. Follow these instructions at home: Medicines  Take over-the-counter and prescription medicines only as told by your health care provider.  Talk with your health care provider about an over-the-counter medicine called gentian violet, which kills bacteria and fungi. Relieving soreness and discomfort To help reduce the discomfort of thrush:  Drink cold liquids such as water or iced tea.  Try flavored ice treats or frozen juices.  Eat foods that are easy to swallow, such as gelatin, ice cream, or custard.  Try drinking from a straw if the patches in your mouth are painful.  General instructions  Eat plain, unflavored yogurt as directed by your health care provider. Check the label to make sure the yogurt contains live cultures. This yogurt can help healthy bacteria to grow in the mouth and can stop the growth of the fungus that causes thrush.  If you wear dentures, remove the dentures before going to bed, brush them vigorously, and soak them in a cleaning solution as directed by your health care provider.  Rinse your mouth with a  warm salt-water mixture several times a day. To make a salt-water mixture, completely dissolve 1/2-1 tsp of salt in 1 cup of warm water. Contact a health care provider if:  Your symptoms are getting worse or are not improving within 7 days of starting treatment.  You have symptoms of a spreading infection, such as white patches on the skin outside of the mouth. This information is not intended to replace advice given to you by your health care provider. Make sure you discuss any questions you have with your health care provider. Document Released: 05/10/2004 Document Revised: 05/09/2016 Document Reviewed: 05/09/2016 Elsevier Interactive Patient Education  2017 [employer].

## 2017-08-15 LAB — AMYLASE: AMYLASE: 65 U/L (ref 31–124)

## 2017-08-15 LAB — LIPASE: Lipase: 31 U/L (ref 14–72)

## 2017-08-16 NOTE — Telephone Encounter (Signed)
Error-Sign Encounter 

## 2017-08-17 ENCOUNTER — Encounter: Payer: Self-pay | Admitting: Family Medicine

## 2017-08-17 LAB — CULTURE, GROUP A STREP: STREP A CULTURE: NEGATIVE

## 2017-08-20 MED ORDER — DIAZEPAM 5 MG PO TABS: 5.0000 mg | ORAL_TABLET | Freq: Four times a day (QID) | ORAL | 0 refills | Status: DC | PRN

## 2017-09-19 ENCOUNTER — Other Ambulatory Visit: Payer: Self-pay

## 2017-09-19 ENCOUNTER — Encounter: Payer: Self-pay | Admitting: Physician Assistant

## 2017-09-19 ENCOUNTER — Ambulatory Visit (INDEPENDENT_AMBULATORY_CARE_PROVIDER_SITE_OTHER): Payer: Medicare (Managed Care) | Admitting: Physician Assistant

## 2017-09-19 VITALS — BP 136/82 | HR 98 | Temp 99.4°F | Resp 18 | Ht 69.0 in | Wt 159.2 lb

## 2017-09-19 DIAGNOSIS — J029 Acute pharyngitis, unspecified: Secondary | ICD-10-CM | POA: Diagnosis not present

## 2017-09-19 DIAGNOSIS — Z6825 Body mass index (BMI) 25.0-25.9, adult: Secondary | ICD-10-CM | POA: Insufficient documentation

## 2017-09-19 DIAGNOSIS — B9789 Other viral agents as the cause of diseases classified elsewhere: Secondary | ICD-10-CM | POA: Diagnosis not present

## 2017-09-19 DIAGNOSIS — J069 Acute upper respiratory infection, unspecified: Secondary | ICD-10-CM | POA: Diagnosis not present

## 2017-09-19 LAB — POCT RAPID STREP A (OFFICE): RAPID STREP A SCREEN: NEGATIVE

## 2017-09-19 MED ORDER — GUAIFENESIN ER 1200 MG PO TB12: 1.0000 | ORAL_TABLET | Freq: Two times a day (BID) | ORAL | 1 refills | Status: AC | PRN

## 2017-09-19 MED ORDER — NYSTATIN 100000 UNIT/ML MT SUSP: 5.0000 mL | Freq: Four times a day (QID) | OROMUCOSAL | 0 refills | Status: AC

## 2017-09-19 MED ORDER — BENZONATATE 100 MG PO CAPS: 100.0000 mg | ORAL_CAPSULE | Freq: Three times a day (TID) | ORAL | 0 refills | Status: DC | PRN

## 2017-09-19 MED ORDER — AZELASTINE HCL 0.1 % NA SOLN: 2.0000 | Freq: Two times a day (BID) | NASAL | 0 refills | Status: AC

## 2017-09-19 NOTE — Patient Instructions (Addendum)
Get plenty of rest and drink at least 64 ounces of water daily. Continue the acetaminophen as needed for throat pain and fever. I anticipate you'll have improvement in the next 48-72 hours, and then continue to improve over the next 5-7 days, until you get back to normal. If the symptoms worsen, do not begin to improve, or do not resolve as expected, please return for additional evaluation.    IF you received an x-ray today, you will receive an invoice from [facility]. Please contact [facility] at [phone] with questions or concerns regarding your invoice.   IF you received labwork today, you will receive an invoice from [facility]. Please contact [facility] at [phone] with questions or concerns regarding your invoice.   Our billing staff will not be able to assist you with questions regarding bills from these companies.  You will be contacted with the lab results as soon as they are available. The fastest way to get your results is to activate your My Chart account. Instructions are located on the last page of this paperwork. If you have not heard from [place] regarding the results in 2 weeks, please contact this office.

## 2017-09-19 NOTE — Progress Notes (Signed)
Patient ID: [name], female    DOB: 1956-08-27, 62 y.o.   MRN: [id number]  PCP: Harrison Mons, PA-C  Chief Complaint  Patient presents with  . Sore Throat    x5 days, Pt states she was dx with a fungal infection 08/14/2017 and pt states she didn't finish her meds. Pt states throat and ears are sore. Pt states she has been taking OTC Tylenol for fever.    Subjective:   Presents for evaluation of sore throat x 5 days.  She was seen for same by one of my colleagues on 08/14/2017, thought due to thrush (KOH test of throat swab was positive, RS was negative). She reported hoarseness following an upper endoscopy 08/03/2017, and denied sinus congestion/pressure/pain. She was prescribed Nystatin suspension, and her symptoms resolved until 5 days ago.  She reports subjective fever, chills, ear fullness and pain, mild cough, body aches, headache and nausea. OTC acetaminophen with temporary relief. She used up the little bit of nystatin suspension she had leftover.  No heartburn/indigestion. No urinary symptoms. No lower GI symptoms.    Review of Systems As above.    Patient Active Problem List   Diagnosis Date Noted  . BMI 25.0-25.9,adult 09/19/2017  . Midline low back pain without sciatica 12/11/2015  . Hypothyroidism 02/18/2015  . Allergic rhinitis due to pollen 10/26/2011  . Chronic neck pain 10/26/2011  . [place] arthritis 10/26/2011  . CTS (carpal tunnel syndrome) 10/26/2011  . PTSD (post-traumatic stress disorder) 10/26/2011  . Hyperlipemia 10/26/2011  . Hearing loss in right ear 10/26/2011  . Major depressive disorder, recurrent episode ([place]) 10/26/2006     Prior to Admission medications   Medication Sig Start Date End Date Taking? Authorizing Provider  amitriptyline (ELAVIL) 50 MG tablet Start 1/2 tab po qhs x 2 wks, then increase to 1 tab po qhs 07/21/17  Yes Shawnee Knapp, MD  aspirin 81 MG tablet Take 81 mg by mouth daily.   Yes [provider]    atorvastatin (LIPITOR) 80 MG tablet Take 1 tablet (80 mg total) by mouth daily. 12/23/16  Yes Akbar Sacra, PA-C  Calcium Carbonate (CALCIUM 600 PO) Take 1 capsule by mouth daily.   Yes [provider]  cyclobenzaprine (FLEXERIL) 10 MG tablet Take 1 tablet (10 mg total) by mouth 3 (three) times daily as needed. for muscle spams 08/16/16  Yes Kyler Lerette, PA-C  diazepam (VALIUM) 5 MG tablet Take 1 tablet (5 mg total) by mouth every 6 (six) hours as needed. F/U with PCP Jacqulynn Cadet in office visit for additional refills. 09/02/17  Yes Shawnee Knapp, MD  FIBER PO Take 1 tablet by mouth daily.   Yes [provider]  FLUoxetine (PROZAC) 40 MG capsule Take 1 capsule (40 mg total) by mouth daily. 05/17/17  Yes Aviona Martenson, PA-C  fluticasone (FLONASE) 50 MCG/ACT nasal spray Place 2 sprays into both nostrils daily. 10/17/16  Yes Jamahl Lemmons, PA-C  ketoconazole (NIZORAL) 2 % cream Apply 1 application topically daily. 01/31/17  Yes Summer Mccolgan, PA-C  levothyroxine (SYNTHROID, LEVOTHROID) 88 MCG tablet TAKE 1 TABLET BY MOUTH DAILY BEFORE BREAKFAST 03/28/17  Yes Caryle Helgeson, PA-C  mometasone (NASONEX) 50 MCG/ACT nasal spray INSTILL 2 SPRAYS IN EACH NOSTRIL DAILY 08/18/16  Yes Siddhant Hashemi, PA-C  Multiple Vitamin (MULTIVITAMIN) tablet Take 1 tablet by mouth daily.   Yes [provider]  nystatin (MYCOSTATIN) 100000 UNIT/ML suspension Take 5 mLs (500,000 Units total) by mouth 4 (four) times daily. 08/14/17  Yes Shawnee Knapp, MD  omeprazole (PRILOSEC) 40 MG capsule Take 1 capsule (40 mg total) by mouth daily. Every morning at least 30 minutes before eating 07/21/17  Yes Shawnee Knapp, MD  promethazine (PHENERGAN) 25 MG tablet Take 1 tablet (25 mg total) by mouth every 8 (eight) hours as needed for nausea or vomiting. 07/28/17  Yes Chasiti Waddington, PA-C  tobramycin (TOBREX) 0.3 % ophthalmic ointment Place 1 application into the left eye 3 (three) times daily.   Yes [provider]  vitamin E 400 UNIT capsule Take 400 Units by mouth daily.   Yes [provider]  ondansetron (ZOFRAN) 4 MG tablet Take 1 tablet (4 mg total) by mouth every 8 (eight) hours as needed for nausea or vomiting. Patient not taking: Reported on 08/14/2017 02/28/17   Harrison Mons, PA-C     Allergies  Allergen Reactions  . Codeine Shortness Of Breath, Nausea And Vomiting and Rash  . Penicillins Anaphylaxis  . Gabapentin     Didn't work, and made her feel really bad  . Morphine And Related   . Pravastatin Other (See Comments)    Chest wall and RUQ abdominal pain 05/2009  . Prednisone Other (See Comments)    Dizzy  . Rosuvastatin Other (See Comments)    11/2010  . Sudafed [Pseudoephedrine Hcl] Other (See Comments)    "Hyper"   . Sulfa Antibiotics Rash       Objective:  Physical Exam  Constitutional: She is oriented to person, place, and time. She appears well-developed and well-nourished. She is active and cooperative. No distress.  BP 136/82 (BP Location: Right Arm, Patient Position: Sitting, Cuff Size: Normal)   Pulse 98   Temp 99.4 F (37.4 C) (Oral)   Resp 18   Ht 5\' 9"  (1.753 m)   Wt 159 lb 3.2 oz (72.2 kg)   SpO2 96%   BMI 23.51 kg/m   HENT:  Head: Normocephalic and atraumatic.  Right Ear: Hearing, tympanic membrane, external ear and ear canal normal.  Left Ear: Hearing, tympanic membrane, external ear and ear canal normal.  Nose: Mucosal edema (mild) present. No rhinorrhea, nose lacerations, sinus tenderness, nasal deformity, septal deviation or nasal septal hematoma. No epistaxis.  No foreign bodies. Right sinus exhibits no maxillary sinus tenderness and no frontal sinus tenderness. Left sinus exhibits no maxillary sinus tenderness and no frontal sinus tenderness.  Mouth/Throat: Uvula is midline and mucous membranes are normal. Normal dentition. Posterior oropharyngeal erythema (mild) present. No oropharyngeal exudate, posterior oropharyngeal edema or  tonsillar abscesses.  Eyes: Conjunctivae are normal. No scleral icterus.  Neck: Normal range of motion and phonation normal. Neck supple. No thyroid mass and no thyromegaly present.  Cardiovascular: Normal rate, regular rhythm and normal heart sounds.  Pulses:      Radial pulses are 2+ on the right side, and 2+ on the left side.  Pulmonary/Chest: Effort normal and breath sounds normal. Chest wall is not dull to percussion. She exhibits tenderness. She exhibits no mass, no bony tenderness, no laceration, no crepitus, no edema, no deformity, no swelling and no retraction.  Lymphadenopathy:       Head (right side): No tonsillar, no preauricular, no posterior auricular and no occipital adenopathy present.       Head (left side): No tonsillar, no preauricular, no posterior auricular and no occipital adenopathy present.    She has cervical adenopathy (shotty, mildly tender).       Right: No supraclavicular adenopathy present.  Left: No supraclavicular adenopathy present.  Neurological: She is alert and oriented to person, place, and time. No sensory deficit.  Skin: Skin is warm, dry and intact. No rash noted. No cyanosis or erythema. Nails show no clubbing.  Psychiatric: She has a normal mood and affect. Her speech is normal and behavior is normal.      Results for orders placed or performed in visit on 09/19/17  POCT rapid strep A  Result Value Ref Range   Rapid Strep A Screen Negative Negative        Assessment & Plan:   1. Sore throat 2. Viral URI with cough Await TCx. Supportive care and symptomatic treatment.  - POCT rapid strep A - Culture, Group A Strep - nystatin (MYCOSTATIN) 100000 UNIT/ML suspension; Take 5 mLs (500,000 Units total) by mouth 4 (four) times daily.  Dispense: 200 mL; Refill: 0 - benzonatate (TESSALON) 100 MG capsule; Take 1-2 capsules (100-200 mg total) by mouth 3 (three) times daily as needed for cough.  Dispense: 40 capsule; Refill: 0 - azelastine  (ASTELIN) 0.1 % nasal spray; Place 2 sprays into both nostrils 2 (two) times daily. Use in each nostril as directed  Dispense: 30 mL; Refill: 0 - Guaifenesin (MUCINEX MAXIMUM STRENGTH) 1200 MG TB12; Take 1 tablet (1,200 mg total) by mouth every 12 (twelve) hours as needed.  Dispense: 14 tablet; Refill: 1    Return in about 3 months (around 12/18/2017), or if symptoms worsen or fail to improve, for re-evalaution of chronic problems.   Fara Chute, PA-C [facility] [place]

## 2017-09-19 NOTE — Progress Notes (Signed)
Subjective:    Patient ID: [name], female    DOB: 10-03-1955, 62 y.o.   MRN: [id number]  Chief Complaint  Patient presents with  . Sore Throat    x5 days, Pt states she was dx with a fungal infection 08/14/2017 and pt states she didn't finish her meds. Pt states throat and ears are sore. Pt states she has been taking OTC Tylenol for fever.   Patient was last seen by Dr. Brigitte Pulse on 08/14/17 for acute pharyngitis due to thrust. She was treated with Nystatin suspension. POCT skin KOH was positive. POCT rapid strep A and strep culture was negative. Previous endoscopic exam on 08/03/2017. She used her Nystatin as prescribed. Felt well, until the end of last week. (around 09/15/17)  Chart reviewed from previous visit. Symptoms present differently today than visit on 08/14/17.  Symptoms consist of: feverish feeling (does not have a thermometer at home so has not taken her temperature), chills, ears hurt/full, sore throat, minor cough, myalgias, chest pain, headache, and nausea.She has taken Tylenol Extra strength BID every 5 hours, which helps. She had a little nystatin left over and used what she had left.   Denies: heartburn, indigestion, diarrhea, constipation, or unexpected weight loss.   Review of Systems As above.   Patient Active Problem List   Diagnosis Date Noted  . BMI 25.0-25.9,adult 09/19/2017  . Midline low back pain without sciatica 12/11/2015  . Hypothyroidism 02/18/2015  . Allergic rhinitis due to pollen 10/26/2011  . Chronic neck pain 10/26/2011  . [place] arthritis 10/26/2011  . CTS (carpal tunnel syndrome) 10/26/2011  . PTSD (post-traumatic stress disorder) 10/26/2011  . Hyperlipemia 10/26/2011  . Hearing loss in right ear 10/26/2011  . Major depressive disorder, recurrent episode ([place]) 10/26/2006   Prior to Admission medications   Medication Sig Start Date End Date Taking? Authorizing Provider  amitriptyline (ELAVIL) 50 MG tablet Start 1/2 tab po qhs x 2 wks, then  increase to 1 tab po qhs 07/21/17  Yes Shawnee Knapp, MD  aspirin 81 MG tablet Take 81 mg by mouth daily.   Yes [provider]  atorvastatin (LIPITOR) 80 MG tablet Take 1 tablet (80 mg total) by mouth daily. 12/23/16  Yes Jeffery, [name], PA-C  Calcium Carbonate (CALCIUM 600 PO) Take 1 capsule by mouth daily.   Yes [provider]  cyclobenzaprine (FLEXERIL) 10 MG tablet Take 1 tablet (10 mg total) by mouth 3 (three) times daily as needed. for muscle spams 08/16/16  Yes Jeffery, [name], PA-C  diazepam (VALIUM) 5 MG tablet Take 1 tablet (5 mg total) by mouth every 6 (six) hours as needed. F/U with PCP Jacqulynn Cadet in office visit for additional refills. 09/02/17  Yes Shawnee Knapp, MD  FIBER PO Take 1 tablet by mouth daily.   Yes [provider]  FLUoxetine (PROZAC) 40 MG capsule Take 1 capsule (40 mg total) by mouth daily. 05/17/17  Yes Jeffery, [name], PA-C  fluticasone (FLONASE) 50 MCG/ACT nasal spray Place 2 sprays into both nostrils daily. 10/17/16  Yes Jeffery, [name], PA-C  ketoconazole (NIZORAL) 2 % cream Apply 1 application topically daily. 01/31/17  Yes Jeffery, [name], PA-C  levothyroxine (SYNTHROID, LEVOTHROID) 88 MCG tablet TAKE 1 TABLET BY MOUTH DAILY BEFORE BREAKFAST 03/28/17  Yes Jeffery, [name], PA-C  mometasone (NASONEX) 50 MCG/ACT nasal spray INSTILL 2 SPRAYS IN EACH NOSTRIL DAILY 08/18/16  Yes Jeffery, [name], PA-C  Multiple Vitamin (MULTIVITAMIN) tablet Take 1 tablet by mouth daily.   Yes [provider]  nystatin (MYCOSTATIN) 100000 UNIT/ML suspension Take 5 mLs (500,000 Units total) by mouth 4 (four) times daily. 09/19/17  Yes Jeffery, [name], PA-C  omeprazole (PRILOSEC) 40 MG capsule Take 1 capsule (40 mg total) by mouth daily. Every morning at least 30 minutes before eating 07/21/17  Yes Shawnee Knapp, MD  promethazine (PHENERGAN) 25 MG tablet Take 1 tablet (25 mg total) by mouth every 8 (eight) hours as needed for nausea or vomiting. 07/28/17  Yes Jeffery,  [name], PA-C  tobramycin (TOBREX) 0.3 % ophthalmic ointment Place 1 application into the left eye 3 (three) times daily.   Yes [provider]  vitamin E 400 UNIT capsule Take 400 Units by mouth daily.   Yes [provider]                       ondansetron (ZOFRAN) 4 MG tablet Take 1 tablet (4 mg total) by mouth every 8 (eight) hours as needed for nausea or vomiting. Patient not taking: Reported on 08/14/2017 02/28/17   Harrison Mons, PA-C   Allergies  Allergen Reactions  . Codeine Shortness Of Breath, Nausea And Vomiting and Rash  . Penicillins Anaphylaxis  . Gabapentin     Didn't work, and made her feel really bad  . Morphine And Related   . Pravastatin Other (See Comments)    Chest wall and RUQ abdominal pain 05/2009  . Prednisone Other (See Comments)    Dizzy  . Rosuvastatin Other (See Comments)    11/2010  . Sudafed [Pseudoephedrine Hcl] Other (See Comments)    "Hyper"   . Sulfa Antibiotics Rash       Objective:   Physical Exam  Constitutional: She is oriented to person, place, and time. She appears well-developed and well-nourished.  HENT:  Head: Normocephalic and atraumatic.  Right Ear: Tympanic membrane and ear canal normal.  Left Ear: Tympanic membrane and ear canal normal.  Nose: Mucosal edema (mild) present.  Mouth/Throat: Posterior oropharyngeal erythema (tonsils erythematous) present.  Neck: Muscular tenderness: anterior neck tender to palpation.  Cardiovascular: Normal rate and regular rhythm.  Pulses:      Radial pulses are 2+ on the right side, and 2+ on the left side.  Pulmonary/Chest: Breath sounds normal. No accessory muscle usage. No respiratory distress. Tenderness: to palpation medial chest. Reproducible.  Lymphadenopathy:    She has cervical adenopathy (shotty, tender, lymphadenopathy).  Neurological: She is alert and oriented to person, place, and time.  Psychiatric: She has a normal mood and affect. Her behavior is normal.        Assessment & Plan:  1. Sore throat 2. Viral URI with cough Rapid strep negative. Await culture results. Will adjust treatment as indicated by culture results. Start Benzonatate, Azelastine, and Guaifenesin. Continue Tylenol as needed. Refill of Nystatin provided to treat any reoccurrence of thrush.    - POCT rapid strep A - Culture, Group A Strep - nystatin (MYCOSTATIN) 100000 UNIT/ML suspension; Take 5 mLs (500,000 Units total) by mouth 4 (four) times daily.  Dispense: 200 mL; Refill: 0  - benzonatate (TESSALON) 100 MG capsule; Take 1-2 capsules (100-200 mg total) by mouth 3 (three) times daily as needed for cough.  Dispense: 40 capsule; Refill: 0 - azelastine (ASTELIN) 0.1 % nasal spray; Place 2 sprays into both nostrils 2 (two) times daily. Use in each nostril as directed  Dispense: 30 mL; Refill: 0 - Guaifenesin (MUCINEX MAXIMUM STRENGTH) 1200 MG TB12; Take 1 tablet (1,200 mg total) by mouth every  12 (twelve) hours as needed.  Dispense: 14 tablet; Refill: 1  Return in about 3 months (around 12/18/2017), or if symptoms worsen or fail to improve, for re-evalaution of chronic problems.  Noemi Chapel, PA-S

## 2017-09-21 ENCOUNTER — Encounter: Payer: Self-pay | Admitting: Physician Assistant

## 2017-09-21 LAB — CULTURE, GROUP A STREP: Strep A Culture: NEGATIVE

## 2017-10-01 ENCOUNTER — Other Ambulatory Visit: Payer: Self-pay | Admitting: Physician Assistant

## 2017-10-01 DIAGNOSIS — F4310 Post-traumatic stress disorder, unspecified: Secondary | ICD-10-CM

## 2017-10-01 DIAGNOSIS — E038 Other specified hypothyroidism: Secondary | ICD-10-CM

## 2017-10-01 DIAGNOSIS — M545 Low back pain, unspecified: Secondary | ICD-10-CM

## 2017-10-01 DIAGNOSIS — G8929 Other chronic pain: Secondary | ICD-10-CM

## 2017-10-01 DIAGNOSIS — M542 Cervicalgia: Secondary | ICD-10-CM

## 2017-10-01 DIAGNOSIS — F332 Major depressive disorder, recurrent severe without psychotic features: Secondary | ICD-10-CM

## 2017-10-11 ENCOUNTER — Other Ambulatory Visit: Payer: Self-pay | Admitting: Physician Assistant

## 2017-10-11 DIAGNOSIS — J069 Acute upper respiratory infection, unspecified: Secondary | ICD-10-CM

## 2017-10-11 DIAGNOSIS — B9789 Other viral agents as the cause of diseases classified elsewhere: Principal | ICD-10-CM

## 2017-10-12 NOTE — Telephone Encounter (Signed)
Left pt. A message to call back to discuss if she is having symptoms and/or possibly needs OV.

## 2017-10-13 NOTE — Telephone Encounter (Signed)
Routing back to [employer]

## 2017-10-27 DIAGNOSIS — H5203 Hypermetropia, bilateral: Secondary | ICD-10-CM | POA: Diagnosis not present

## 2017-10-27 DIAGNOSIS — H2513 Age-related nuclear cataract, bilateral: Secondary | ICD-10-CM | POA: Diagnosis not present

## 2017-10-27 DIAGNOSIS — H524 Presbyopia: Secondary | ICD-10-CM | POA: Diagnosis not present

## 2017-10-29 ENCOUNTER — Other Ambulatory Visit: Payer: Self-pay | Admitting: Physician Assistant

## 2017-10-29 DIAGNOSIS — F4310 Post-traumatic stress disorder, unspecified: Secondary | ICD-10-CM

## 2017-10-29 DIAGNOSIS — M542 Cervicalgia: Secondary | ICD-10-CM

## 2017-10-29 DIAGNOSIS — M545 Low back pain: Secondary | ICD-10-CM

## 2017-10-29 DIAGNOSIS — G8929 Other chronic pain: Secondary | ICD-10-CM

## 2017-10-29 DIAGNOSIS — F332 Major depressive disorder, recurrent severe without psychotic features: Secondary | ICD-10-CM

## 2017-11-01 MED ORDER — DIAZEPAM 5 MG PO TABS: 5.0000 mg | ORAL_TABLET | Freq: Four times a day (QID) | ORAL | 0 refills | Status: DC | PRN

## 2017-11-01 MED ORDER — PROMETHAZINE HCL 25 MG PO TABS: 25.0000 mg | ORAL_TABLET | Freq: Three times a day (TID) | ORAL | 0 refills | Status: DC | PRN

## 2017-12-03 ENCOUNTER — Other Ambulatory Visit: Payer: Self-pay | Admitting: Physician Assistant

## 2017-12-03 DIAGNOSIS — M545 Low back pain: Secondary | ICD-10-CM

## 2017-12-03 DIAGNOSIS — M542 Cervicalgia: Secondary | ICD-10-CM

## 2017-12-03 DIAGNOSIS — F332 Major depressive disorder, recurrent severe without psychotic features: Secondary | ICD-10-CM

## 2017-12-03 DIAGNOSIS — G8929 Other chronic pain: Secondary | ICD-10-CM

## 2017-12-03 DIAGNOSIS — F4310 Post-traumatic stress disorder, unspecified: Secondary | ICD-10-CM

## 2017-12-04 ENCOUNTER — Encounter: Payer: Self-pay | Admitting: Physician Assistant

## 2017-12-04 ENCOUNTER — Other Ambulatory Visit: Payer: Self-pay | Admitting: Physician Assistant

## 2017-12-04 DIAGNOSIS — M545 Low back pain: Secondary | ICD-10-CM

## 2017-12-04 DIAGNOSIS — F4310 Post-traumatic stress disorder, unspecified: Secondary | ICD-10-CM

## 2017-12-04 DIAGNOSIS — G8929 Other chronic pain: Secondary | ICD-10-CM

## 2017-12-04 DIAGNOSIS — F332 Major depressive disorder, recurrent severe without psychotic features: Secondary | ICD-10-CM

## 2017-12-04 DIAGNOSIS — M542 Cervicalgia: Secondary | ICD-10-CM

## 2017-12-04 NOTE — Telephone Encounter (Signed)
Upcoming appointment 12/19/17  PCP: Jacqulynn Cadet

## 2017-12-04 NOTE — Telephone Encounter (Signed)
Patient has appointment scheduled 4/23

## 2017-12-05 ENCOUNTER — Other Ambulatory Visit: Payer: Self-pay

## 2017-12-05 MED ORDER — PROMETHAZINE HCL 25 MG PO TABS: 25.0000 mg | ORAL_TABLET | Freq: Three times a day (TID) | ORAL | 0 refills | Status: AC | PRN

## 2017-12-05 MED ORDER — DIAZEPAM 5 MG PO TABS: 5.0000 mg | ORAL_TABLET | Freq: Four times a day (QID) | ORAL | 0 refills | Status: DC | PRN

## 2017-12-05 NOTE — Telephone Encounter (Signed)
Meds ordered this encounter  Medications  . promethazine (PHENERGAN) 25 MG tablet    Sig: Take 1 tablet (25 mg total) by mouth every 8 (eight) hours as needed for nausea or vomiting.    Dispense:  20 tablet    Refill:  0  . diazepam (VALIUM) 5 MG tablet    Sig: Take 1 tablet (5 mg total) by mouth every 6 (six) hours as needed.    Dispense:  120 tablet    Refill:  0

## 2017-12-05 NOTE — Telephone Encounter (Signed)
Please advise 

## 2017-12-19 ENCOUNTER — Ambulatory Visit (INDEPENDENT_AMBULATORY_CARE_PROVIDER_SITE_OTHER): Payer: Medicare Other | Admitting: Physician Assistant

## 2017-12-19 ENCOUNTER — Encounter: Payer: Self-pay | Admitting: Physician Assistant

## 2017-12-19 ENCOUNTER — Other Ambulatory Visit: Payer: Self-pay

## 2017-12-19 VITALS — BP 122/64 | HR 88 | Temp 98.1°F | Resp 16 | Ht 69.0 in | Wt 158.6 lb

## 2017-12-19 DIAGNOSIS — M545 Low back pain, unspecified: Secondary | ICD-10-CM

## 2017-12-19 DIAGNOSIS — E039 Hypothyroidism, unspecified: Secondary | ICD-10-CM | POA: Diagnosis not present

## 2017-12-19 DIAGNOSIS — E785 Hyperlipidemia, unspecified: Secondary | ICD-10-CM | POA: Diagnosis not present

## 2017-12-19 DIAGNOSIS — M542 Cervicalgia: Secondary | ICD-10-CM

## 2017-12-19 DIAGNOSIS — F339 Major depressive disorder, recurrent, unspecified: Secondary | ICD-10-CM | POA: Diagnosis not present

## 2017-12-19 DIAGNOSIS — G8929 Other chronic pain: Secondary | ICD-10-CM

## 2017-12-19 NOTE — Assessment & Plan Note (Signed)
Doing reasonably well on her current regimen of cyclobenzaprine.

## 2017-12-19 NOTE — Assessment & Plan Note (Signed)
Hasn't taken atorvastatin in about 1 month. Update fasting labs. Resume statin if indicated.

## 2017-12-19 NOTE — Progress Notes (Signed)
Patient ID: [name], female    DOB: [birth year], 62 y.o.   MRN: [id number]  PCP: Harrison Mons, PA-C  Chief Complaint  Patient presents with  . Follow-up    chronic problems     Subjective:   Presents for evaluation of chronic pain, depression, hypothyroidism, hyperlipidemia.  Overall, she's doing well. Has persistent pain, she thinks that mowing her grass last week aggravated her back, as she is more sore than usual. Saw her eye specialist 11/14/2017, Dr. Gershon Crane. Her son-in-law died November 28, 2017, of apparent drug overdose. He and her daughter had split up, they lost their home after he lost his job due to drug use. He was living with one of their children with his parents. Her daughter and granddaughter, Hanley Hays, are living with her. Her grandson, Darral Dash is living with his father's mother. Another grandson, Landry Dyke, is living with his paternal grandparents. She has a total of 7 grandchildren.  The woman who served as a mother figure after her biological mother's death died the following week.  Reports fatigue. "No energy." Takes fluoxetine and amitriptyline, levothyroxine. Tolerating these without adverse effects. Not taking simvastatin or vitamins.   Review of Systems As above    Patient Active Problem List   Diagnosis Date Noted  . BMI 25.0-25.9,adult 09/19/2017  . Midline low back pain without sciatica 12/11/2015  . Hypothyroidism 02/18/2015  . Allergic rhinitis due to pollen 10/26/2011  . Chronic neck pain 10/26/2011  . [place] arthritis 10/26/2011  . CTS (carpal tunnel syndrome) 10/26/2011  . PTSD (post-traumatic stress disorder) 10/26/2011  . Hyperlipemia 10/26/2011  . Hearing loss in right ear 10/26/2011  . Major depressive disorder, recurrent episode ([place]) 10/26/2006     Prior to Admission medications   Medication Sig Start Date End Date Taking? Authorizing Provider  amitriptyline (ELAVIL) 50 MG tablet Start 1/2 tab po qhs x 2 wks, then increase  to 1 tab po qhs 07/21/17  Yes Shawnee Knapp, MD  aspirin 81 MG tablet Take 81 mg by mouth daily.   Yes [provider]  atorvastatin (LIPITOR) 80 MG tablet Take 1 tablet (80 mg total) by mouth daily. 12/23/16  Yes Adaya Garmany, PA-C  azelastine (ASTELIN) 0.1 % nasal spray Place 2 sprays into both nostrils 2 (two) times daily. Use in each nostril as directed 09/19/17  Yes Betzaida Cremeens, PA-C  Calcium Carbonate (CALCIUM 600 PO) Take 1 capsule by mouth daily.   Yes [provider]  cyclobenzaprine (FLEXERIL) 10 MG tablet Take 1 tablet (10 mg total) by mouth 3 (three) times daily as needed. for muscle spams 08/16/16  Yes Dante Cooter, PA-C  diazepam (VALIUM) 5 MG tablet Take 1 tablet (5 mg total) by mouth every 6 (six) hours as needed. 12/05/17  Yes Marrianne Sica, PA-C  FIBER PO Take 1 tablet by mouth daily.   Yes [provider]  FLUoxetine (PROZAC) 40 MG capsule Take 1 capsule (40 mg total) by mouth daily. 05/17/17  Yes Marybell Robards, PA-C  fluticasone (FLONASE) 50 MCG/ACT nasal spray Place 2 sprays into both nostrils daily. 10/17/16  Yes Kashten Gowin, PA-C  ketoconazole (NIZORAL) 2 % cream Apply 1 application topically daily. 01/31/17  Yes Seleny Allbright, PA-C  levothyroxine (SYNTHROID, LEVOTHROID) 88 MCG tablet TAKE 1 TABLET BY MOUTH DAILY BEFORE BREAKFAST 10/02/17  Yes Landis Dowdy, PA-C  mometasone (NASONEX) 50 MCG/ACT nasal spray INSTILL 2 SPRAYS IN EACH NOSTRIL DAILY 08/18/16  Yes Tida Saner, PA-C  Multiple Vitamin (MULTIVITAMIN) tablet Take 1 tablet  by mouth daily.   Yes [provider]  omeprazole (PRILOSEC) 40 MG capsule Take 1 capsule (40 mg total) by mouth daily. Every morning at least 30 minutes before eating 07/21/17  Yes Shawnee Knapp, MD  ondansetron (ZOFRAN) 4 MG tablet Take 1 tablet (4 mg total) by mouth every 8 (eight) hours as needed for nausea or vomiting. 02/28/17  Yes Hellon Vaccarella, PA-C  promethazine (PHENERGAN) 25 MG tablet Take 1  tablet (25 mg total) by mouth every 8 (eight) hours as needed for nausea or vomiting. 12/05/17  Yes Sher Shampine, PA-C  tobramycin (TOBREX) 0.3 % ophthalmic ointment Place 1 application into the left eye 3 (three) times daily.   Yes [provider]  vitamin E 400 UNIT capsule Take 400 Units by mouth daily.   Yes [provider]  benzonatate (TESSALON) 100 MG capsule TAKE 1 TO 2 CAPSULES(100 TO 200 MG) BY MOUTH THREE TIMES DAILY AS NEEDED FOR COUGH Patient not taking: Reported on 12/19/2017 10/14/17   Harrison Mons, PA-C  Guaifenesin (MUCINEX MAXIMUM STRENGTH) 1200 MG TB12 Take 1 tablet (1,200 mg total) by mouth every 12 (twelve) hours as needed. Patient not taking: Reported on 12/19/2017 09/19/17   Harrison Mons, PA-C  nystatin (MYCOSTATIN) 100000 UNIT/ML suspension Take 5 mLs (500,000 Units total) by mouth 4 (four) times daily. Patient not taking: Reported on 12/19/2017 09/19/17   Harrison Mons, PA-C     Allergies  Allergen Reactions  . Codeine Shortness Of Breath, Nausea And Vomiting and Rash  . Penicillins Anaphylaxis  . Gabapentin     Didn't work, and made her feel really bad  . Morphine And Related   . Pravastatin Other (See Comments)    Chest wall and RUQ abdominal pain 05/2009  . Prednisone Other (See Comments)    Dizzy  . Rosuvastatin Other (See Comments)    11/2010  . Sudafed [Pseudoephedrine Hcl] Other (See Comments)    "Hyper"   . Sulfa Antibiotics Rash       Objective:  Physical Exam  Constitutional: She is oriented to person, place, and time. She appears well-developed and well-nourished. She is active and cooperative. No distress.  BP 122/64   Pulse 88   Temp 98.1 F (36.7 C) (Oral)   Resp 16   Ht 5\' 9"  (1.753 m)   Wt 158 lb 9.6 oz (71.9 kg)   SpO2 95%   BMI 23.42 kg/m   HENT:  Head: Normocephalic and atraumatic.  Right Ear: Hearing normal.  Left Ear: Hearing normal.  Eyes: Conjunctivae are normal. No scleral icterus.  Neck: Normal  range of motion. Neck supple. No thyromegaly present.  Cardiovascular: Normal rate, regular rhythm and normal heart sounds.  Pulses:      Radial pulses are 2+ on the right side, and 2+ on the left side.  Pulmonary/Chest: Effort normal and breath sounds normal.  Lymphadenopathy:       Head (right side): No tonsillar, no preauricular, no posterior auricular and no occipital adenopathy present.       Head (left side): No tonsillar, no preauricular, no posterior auricular and no occipital adenopathy present.    She has no cervical adenopathy.       Right: No supraclavicular adenopathy present.       Left: No supraclavicular adenopathy present.  Neurological: She is alert and oriented to person, place, and time. No sensory deficit.  Skin: Skin is warm, dry and intact. No rash noted. No cyanosis or erythema. Nails show no clubbing.  Psychiatric: She has a normal mood and affect. Her speech is normal and behavior is normal.    Wt Readings from Last 3 Encounters:  12/19/17 158 lb 9.6 oz (71.9 kg)  09/19/17 159 lb 3.2 oz (72.2 kg)  08/14/17 156 lb 6.4 oz (70.9 kg)       Assessment & Plan:   Problem List Items Addressed This Visit    Major depressive disorder, recurrent episode ([place]) - Primary    Doing well on her current regimen, tolerating medications. Has managed well despite the recent death of her son-in-law and mother-figure in the same week. No changes today.      Chronic neck pain    Doing reasonably well on her current regimen of cyclobenzaprine.      Hyperlipemia    Hasn't taken atorvastatin in about 1 month. Update fasting labs. Resume statin if indicated.      Relevant Orders   Comprehensive metabolic panel   Lipid panel   Hypothyroidism   Relevant Orders   T4, free   TSH   T3, free   Midline low back pain without sciatica    Continues to have pain, aggravated by overdoing the exercise, like mowing the grass on the hill in her yard. Continue cyclobenzaprine and  amitriptyline.          Return in about 6 months (around 06/20/2018) for re-evaluation of cholesterol and mood.   Fara Chute, PA-C [facility] [place]

## 2017-12-19 NOTE — Assessment & Plan Note (Signed)
Continues to have pain, aggravated by overdoing the exercise, like mowing the grass on the hill in her yard. Continue cyclobenzaprine and amitriptyline.

## 2017-12-19 NOTE — Assessment & Plan Note (Signed)
Doing well on her current regimen, tolerating medications. Has managed well despite the recent death of her son-in-law and mother-figure in the same week. No changes today.

## 2017-12-19 NOTE — Patient Instructions (Addendum)
Stay active, but maybe go easy on yourself, don't do too much at one time. Walking regularly can be enough. Try a goal of 150 minutes/week. Start with 15 minutes 3 days/week. Gradually increase the time to 30 minutes 5 days/week.    IF you received an x-ray today, you will receive an invoice from [facility]. Please contact [facility] at [phone] with questions or concerns regarding your invoice.   IF you received labwork today, you will receive an invoice from [place]. Please contact [facility] at [phone] with questions or concerns regarding your invoice.   Our billing staff will not be able to assist you with questions regarding bills from these companies.  You will be contacted with the lab results as soon as they are available. The fastest way to get your results is to activate your My Chart account. Instructions are located on the last page of this paperwork. If you have not heard from [place] regarding the results in 2 weeks, please contact this office.

## 2017-12-20 LAB — COMPREHENSIVE METABOLIC PANEL
A/G RATIO: 1.6 (ref 1.2–2.2)
ALBUMIN: 4.5 g/dL (ref 3.6–4.8)
ALT: 21 IU/L (ref 0–32)
AST: 16 IU/L (ref 0–40)
Alkaline Phosphatase: 99 IU/L (ref 39–117)
BILIRUBIN TOTAL: 0.2 mg/dL (ref 0.0–1.2)
BUN/Creatinine Ratio: 11 — ABNORMAL LOW (ref 12–28)
BUN: 10 mg/dL (ref 8–27)
CALCIUM: 9.5 mg/dL (ref 8.7–10.3)
CHLORIDE: 102 mmol/L (ref 96–106)
CO2: 23 mmol/L (ref 20–29)
Creatinine, Ser: 0.94 mg/dL (ref 0.57–1.00)
GFR, EST AFRICAN AMERICAN: 74 mL/min/{1.73_m2} (ref >59)
GFR, EST NON AFRICAN AMERICAN: 64 mL/min/{1.73_m2} (ref >59)
GLOBULIN, TOTAL: 2.8 g/dL (ref 1.5–4.5)
Glucose: 94 mg/dL (ref 65–99)
POTASSIUM: 4.3 mmol/L (ref 3.5–5.2)
SODIUM: 143 mmol/L (ref 134–144)
TOTAL PROTEIN: 7.3 g/dL (ref 6.0–8.5)

## 2017-12-20 LAB — LIPID PANEL
Chol/HDL Ratio: 4.6 ratio — ABNORMAL HIGH (ref 0.0–4.4)
Cholesterol, Total: 246 mg/dL — ABNORMAL HIGH (ref 100–199)
HDL: 53 mg/dL (ref >39)
LDL Calculated: 167 mg/dL — ABNORMAL HIGH (ref 0–99)
Triglycerides: 128 mg/dL (ref 0–149)
VLDL CHOLESTEROL CAL: 26 mg/dL (ref 5–40)

## 2017-12-20 LAB — T4, FREE: Free T4: 1.47 ng/dL (ref 0.82–1.77)

## 2017-12-20 LAB — T3, FREE: T3 FREE: 2.5 pg/mL (ref 2.0–4.4)

## 2017-12-20 LAB — TSH: TSH: 0.287 u[IU]/mL — ABNORMAL LOW (ref 0.450–4.500)

## 2018-01-04 ENCOUNTER — Other Ambulatory Visit: Payer: Self-pay | Admitting: Physician Assistant

## 2018-01-04 DIAGNOSIS — F332 Major depressive disorder, recurrent severe without psychotic features: Secondary | ICD-10-CM

## 2018-01-04 DIAGNOSIS — G8929 Other chronic pain: Secondary | ICD-10-CM

## 2018-01-04 DIAGNOSIS — F4310 Post-traumatic stress disorder, unspecified: Secondary | ICD-10-CM

## 2018-01-04 DIAGNOSIS — M545 Low back pain: Secondary | ICD-10-CM

## 2018-01-04 DIAGNOSIS — E038 Other specified hypothyroidism: Secondary | ICD-10-CM

## 2018-01-04 DIAGNOSIS — M542 Cervicalgia: Secondary | ICD-10-CM

## 2018-01-05 ENCOUNTER — Other Ambulatory Visit: Payer: Self-pay | Admitting: Physician Assistant

## 2018-01-05 DIAGNOSIS — F4310 Post-traumatic stress disorder, unspecified: Secondary | ICD-10-CM

## 2018-01-05 DIAGNOSIS — M545 Low back pain, unspecified: Secondary | ICD-10-CM

## 2018-01-05 DIAGNOSIS — F332 Major depressive disorder, recurrent severe without psychotic features: Secondary | ICD-10-CM

## 2018-01-05 DIAGNOSIS — M542 Cervicalgia: Secondary | ICD-10-CM

## 2018-01-05 DIAGNOSIS — G8929 Other chronic pain: Secondary | ICD-10-CM

## 2018-01-05 NOTE — Telephone Encounter (Signed)
Diazepam refill Last OV: 12/19/17 Last refill:12/05/17 120 tab/0 refill RDE:[id number] Pharmacy: [facility] [place], [place] [phone] (Phone) [phone] (Fax)

## 2018-01-08 NOTE — Telephone Encounter (Signed)
Last OV 12/19/17. Last Rx this medication 12/05/2017.  Meds ordered this encounter  Medications  . diazepam (VALIUM) 5 MG tablet    Sig: TAKE 1 TABLET(5 MG) BY MOUTH EVERY 6 HOURS AS NEEDED    Dispense:  120 tablet    Refill:  0

## 2018-01-08 NOTE — Telephone Encounter (Signed)
Patient had OV on 12/19/17 with 120 tablets with no refills. Please advise, thank you.

## 2018-01-27 ENCOUNTER — Other Ambulatory Visit: Payer: Self-pay | Admitting: Family Medicine

## 2018-01-29 ENCOUNTER — Other Ambulatory Visit: Payer: Self-pay

## 2018-02-01 ENCOUNTER — Other Ambulatory Visit: Payer: Self-pay | Admitting: Physician Assistant

## 2018-02-01 DIAGNOSIS — B9789 Other viral agents as the cause of diseases classified elsewhere: Principal | ICD-10-CM

## 2018-02-01 DIAGNOSIS — J069 Acute upper respiratory infection, unspecified: Secondary | ICD-10-CM

## 2018-02-06 DIAGNOSIS — Z1231 Encounter for screening mammogram for malignant neoplasm of breast: Secondary | ICD-10-CM | POA: Diagnosis not present

## 2018-02-06 DIAGNOSIS — Z01419 Encounter for gynecological examination (general) (routine) without abnormal findings: Secondary | ICD-10-CM | POA: Diagnosis not present

## 2018-02-06 DIAGNOSIS — Z6824 Body mass index (BMI) 24.0-24.9, adult: Secondary | ICD-10-CM | POA: Diagnosis not present

## 2018-02-07 ENCOUNTER — Other Ambulatory Visit: Payer: Self-pay | Admitting: Physician Assistant

## 2018-02-07 DIAGNOSIS — G8929 Other chronic pain: Secondary | ICD-10-CM

## 2018-02-07 DIAGNOSIS — F4310 Post-traumatic stress disorder, unspecified: Secondary | ICD-10-CM

## 2018-02-07 DIAGNOSIS — M545 Low back pain, unspecified: Secondary | ICD-10-CM

## 2018-02-07 DIAGNOSIS — F332 Major depressive disorder, recurrent severe without psychotic features: Secondary | ICD-10-CM

## 2018-02-07 DIAGNOSIS — M542 Cervicalgia: Secondary | ICD-10-CM

## 2018-02-07 LAB — HM MAMMOGRAPHY

## 2018-02-07 NOTE — Telephone Encounter (Signed)
Refill request Valium 5 mg  LOV 12/19/2017 [name] Jeffrey  Last filled 01/08/2018 120 tabs 1 q 6hr prn NR  [facility] on file

## 2018-02-08 NOTE — Telephone Encounter (Signed)
req for Valium refill sent to [facility]

## 2018-02-09 ENCOUNTER — Other Ambulatory Visit: Payer: Self-pay | Admitting: Physician Assistant

## 2018-02-09 NOTE — Telephone Encounter (Signed)
[facility] pharmacy called and spoke to [place], [employer]. I asked what is the date on the atorvastatin prescription being requested to refill, because it says last filled 1 year ago in 04/2017, but according to the profile, the prescription was ordered on 12/23/16, which is expired. She says the prescription showing on their profile is dated 12/23/16. I advised this will need to be sent to the provider for authorization to fill. The patient was called, VM left to call the office back to schedule an appointment with another provider due to [facility] is no longer at [place].   Atorvastatin refill Last refill:12/23/16 90/3 refills (expired) Last OV:12/19/17 TTC:[id number]; No new PCP appointment scheduled Pharmacy: [facility] [place], [place] [phone] (Phone) [phone] (Fax)

## 2018-02-13 ENCOUNTER — Other Ambulatory Visit: Payer: Self-pay | Admitting: *Deleted

## 2018-03-09 ENCOUNTER — Other Ambulatory Visit: Payer: Self-pay | Admitting: Physician Assistant

## 2018-03-09 DIAGNOSIS — M545 Low back pain, unspecified: Secondary | ICD-10-CM

## 2018-03-09 DIAGNOSIS — G8929 Other chronic pain: Secondary | ICD-10-CM

## 2018-03-09 DIAGNOSIS — F332 Major depressive disorder, recurrent severe without psychotic features: Secondary | ICD-10-CM

## 2018-03-09 DIAGNOSIS — M542 Cervicalgia: Secondary | ICD-10-CM

## 2018-03-09 DIAGNOSIS — F4310 Post-traumatic stress disorder, unspecified: Secondary | ICD-10-CM

## 2018-03-09 NOTE — Telephone Encounter (Signed)
Patient is requesting a refill of the following medications: Requested Prescriptions   Pending Prescriptions Disp Refills  . diazepam (VALIUM) 5 MG tablet 120 tablet 0    Date of patient request: 03/09/2018 Last office visit: 12/19/2017 Date of last refill: 02/09/2018 Last refill amount: 120 Follow up time period per chart:

## 2018-03-10 ENCOUNTER — Other Ambulatory Visit: Payer: Self-pay | Admitting: Family Medicine

## 2018-03-10 DIAGNOSIS — F332 Major depressive disorder, recurrent severe without psychotic features: Secondary | ICD-10-CM

## 2018-03-10 DIAGNOSIS — F4310 Post-traumatic stress disorder, unspecified: Secondary | ICD-10-CM

## 2018-03-10 DIAGNOSIS — G8929 Other chronic pain: Secondary | ICD-10-CM

## 2018-03-10 DIAGNOSIS — M542 Cervicalgia: Secondary | ICD-10-CM

## 2018-03-10 DIAGNOSIS — M545 Low back pain: Secondary | ICD-10-CM

## 2018-03-11 MED ORDER — DIAZEPAM 5 MG PO TABS: 5.0000 mg | ORAL_TABLET | Freq: Four times a day (QID) | ORAL | 0 refills | Status: AC

## 2018-03-11 NOTE — Telephone Encounter (Signed)
pmp reviewed.  Chronic bzd use Will need taper if wean is appropriate

## 2018-03-14 ENCOUNTER — Encounter: Payer: Self-pay | Admitting: *Deleted

## 2018-03-28 ENCOUNTER — Ambulatory Visit: Payer: Medicare Other | Admitting: Physician Assistant

## 2018-04-10 DIAGNOSIS — M545 Low back pain: Secondary | ICD-10-CM | POA: Diagnosis not present

## 2018-04-10 DIAGNOSIS — E785 Hyperlipidemia, unspecified: Secondary | ICD-10-CM | POA: Diagnosis not present

## 2018-04-10 DIAGNOSIS — M542 Cervicalgia: Secondary | ICD-10-CM | POA: Diagnosis not present

## 2018-04-10 DIAGNOSIS — R110 Nausea: Secondary | ICD-10-CM | POA: Diagnosis not present

## 2018-04-10 DIAGNOSIS — E039 Hypothyroidism, unspecified: Secondary | ICD-10-CM | POA: Diagnosis not present

## 2018-04-10 DIAGNOSIS — R42 Dizziness and giddiness: Secondary | ICD-10-CM | POA: Diagnosis not present

## 2018-04-10 DIAGNOSIS — G8929 Other chronic pain: Secondary | ICD-10-CM | POA: Diagnosis not present

## 2018-05-07 DIAGNOSIS — L219 Seborrheic dermatitis, unspecified: Secondary | ICD-10-CM | POA: Diagnosis not present

## 2018-05-07 DIAGNOSIS — H9191 Unspecified hearing loss, right ear: Secondary | ICD-10-CM | POA: Diagnosis not present

## 2018-05-07 DIAGNOSIS — H9313 Tinnitus, bilateral: Secondary | ICD-10-CM | POA: Diagnosis not present

## 2018-05-12 ENCOUNTER — Other Ambulatory Visit: Payer: Self-pay | Admitting: Physician Assistant

## 2018-05-14 ENCOUNTER — Other Ambulatory Visit: Payer: Self-pay | Admitting: Physician Assistant

## 2018-07-02 DIAGNOSIS — Z0000 Encounter for general adult medical examination without abnormal findings: Secondary | ICD-10-CM | POA: Diagnosis not present

## 2018-07-02 DIAGNOSIS — M542 Cervicalgia: Secondary | ICD-10-CM | POA: Diagnosis not present

## 2018-07-02 DIAGNOSIS — G8929 Other chronic pain: Secondary | ICD-10-CM | POA: Diagnosis not present

## 2018-07-02 DIAGNOSIS — Z79899 Other long term (current) drug therapy: Secondary | ICD-10-CM | POA: Diagnosis not present

## 2018-07-02 DIAGNOSIS — E039 Hypothyroidism, unspecified: Secondary | ICD-10-CM | POA: Diagnosis not present

## 2018-07-02 DIAGNOSIS — E785 Hyperlipidemia, unspecified: Secondary | ICD-10-CM | POA: Diagnosis not present

## 2018-07-02 DIAGNOSIS — M545 Low back pain: Secondary | ICD-10-CM | POA: Diagnosis not present

## 2018-07-09 DIAGNOSIS — H903 Sensorineural hearing loss, bilateral: Secondary | ICD-10-CM | POA: Diagnosis not present

## 2018-08-13 DIAGNOSIS — G8929 Other chronic pain: Secondary | ICD-10-CM | POA: Diagnosis not present

## 2018-08-13 DIAGNOSIS — M545 Low back pain: Secondary | ICD-10-CM | POA: Diagnosis not present

## 2018-08-13 DIAGNOSIS — R110 Nausea: Secondary | ICD-10-CM | POA: Diagnosis not present

## 2018-09-14 DIAGNOSIS — G8929 Other chronic pain: Secondary | ICD-10-CM | POA: Diagnosis not present

## 2018-09-14 DIAGNOSIS — M545 Low back pain: Secondary | ICD-10-CM | POA: Diagnosis not present

## 2018-10-15 ENCOUNTER — Other Ambulatory Visit: Payer: Self-pay | Admitting: Family Medicine

## 2018-10-15 DIAGNOSIS — M545 Low back pain, unspecified: Secondary | ICD-10-CM

## 2018-10-15 DIAGNOSIS — F4310 Post-traumatic stress disorder, unspecified: Secondary | ICD-10-CM

## 2018-10-15 DIAGNOSIS — F332 Major depressive disorder, recurrent severe without psychotic features: Secondary | ICD-10-CM

## 2018-10-15 DIAGNOSIS — G8929 Other chronic pain: Secondary | ICD-10-CM

## 2018-10-15 DIAGNOSIS — M542 Cervicalgia: Secondary | ICD-10-CM

## 2018-10-16 NOTE — Telephone Encounter (Signed)
Please advise on refill.

## 2018-11-13 DIAGNOSIS — R110 Nausea: Secondary | ICD-10-CM | POA: Diagnosis not present

## 2018-11-13 DIAGNOSIS — W57XXXA Bitten or stung by nonvenomous insect and other nonvenomous arthropods, initial encounter: Secondary | ICD-10-CM | POA: Diagnosis not present

## 2018-11-13 DIAGNOSIS — L989 Disorder of the skin and subcutaneous tissue, unspecified: Secondary | ICD-10-CM | POA: Diagnosis not present

## 2018-11-13 DIAGNOSIS — S80861A Insect bite (nonvenomous), right lower leg, initial encounter: Secondary | ICD-10-CM | POA: Diagnosis not present

## 2019-01-22 DIAGNOSIS — R197 Diarrhea, unspecified: Secondary | ICD-10-CM | POA: Diagnosis not present

## 2019-01-22 DIAGNOSIS — Z1159 Encounter for screening for other viral diseases: Secondary | ICD-10-CM | POA: Diagnosis not present

## 2019-05-17 ENCOUNTER — Other Ambulatory Visit: Payer: Self-pay | Admitting: Physician Assistant

## 2019-05-17 DIAGNOSIS — E2839 Other primary ovarian failure: Secondary | ICD-10-CM

## 2019-07-04 ENCOUNTER — Encounter: Payer: Self-pay | Admitting: Internal Medicine

## 2019-07-19 ENCOUNTER — Encounter: Payer: Self-pay | Admitting: Internal Medicine

## 2019-08-01 ENCOUNTER — Other Ambulatory Visit: Payer: Medicare Other

## 2019-08-20 ENCOUNTER — Encounter: Payer: Medicare Other | Admitting: Internal Medicine

## 2020-01-20 ENCOUNTER — Encounter: Payer: Self-pay | Admitting: Internal Medicine

## 2020-03-16 ENCOUNTER — Other Ambulatory Visit: Payer: Self-pay

## 2020-03-16 ENCOUNTER — Ambulatory Visit (AMBULATORY_SURGERY_CENTER): Payer: Self-pay | Admitting: *Deleted

## 2020-03-16 VITALS — Ht 69.0 in | Wt 164.0 lb

## 2020-03-16 DIAGNOSIS — Z01818 Encounter for other preprocedural examination: Secondary | ICD-10-CM

## 2020-03-16 DIAGNOSIS — Z86010 Personal history of colonic polyps: Secondary | ICD-10-CM

## 2020-03-16 MED ORDER — SUPREP BOWEL PREP KIT 17.5-3.13-1.6 GM/177ML PO SOLN: 1.0000 | Freq: Once | ORAL | 0 refills | Status: AC

## 2020-03-16 NOTE — Progress Notes (Signed)
No egg or soy allergy known to patient  No issues with past sedation with any surgeries or procedures no intubation problems in the past  No diet pills per patient No home 02 use per patient  No blood thinners per patient  Pt denies issues with constipation  No A fib or A flutter  EMMI video to pt or MyChart  COVID 19 guidelines implemented in [place] today   covid test 7-28 1010 am   Due to the COVID-19 pandemic we are asking patients to follow these guidelines. Please only bring one care partner. Please be aware that your care partner may wait in the car in the parking lot or if they feel like they will be too hot to wait in the car, they may wait in the lobby on the 4th floor. All care partners are required to wear a mask the entire time (we do not have any that we can provide them), they need to practice social distancing, and we will do a Covid check for all patient's and care partners when you arrive. Also we will check their temperature and your temperature. If the care partner waits in their car they need to stay in the parking lot the entire time and we will call them on their cell phone when the patient is ready for discharge so they can bring the car to the front of the building. Also all patient's will need to wear a mask into building.

## 2020-03-25 ENCOUNTER — Ambulatory Visit (INDEPENDENT_AMBULATORY_CARE_PROVIDER_SITE_OTHER): Payer: Medicare Other

## 2020-03-25 ENCOUNTER — Other Ambulatory Visit: Payer: Self-pay | Admitting: Internal Medicine

## 2020-03-25 DIAGNOSIS — Z1159 Encounter for screening for other viral diseases: Secondary | ICD-10-CM

## 2020-03-26 LAB — SARS CORONAVIRUS 2 (TAT 6-24 HRS): SARS Coronavirus 2: NEGATIVE

## 2020-03-30 ENCOUNTER — Ambulatory Visit (AMBULATORY_SURGERY_CENTER): Payer: Medicare Other | Admitting: Internal Medicine

## 2020-03-30 ENCOUNTER — Other Ambulatory Visit: Payer: Self-pay

## 2020-03-30 ENCOUNTER — Encounter: Payer: Self-pay | Admitting: Internal Medicine

## 2020-03-30 VITALS — BP 105/70 | HR 74 | Temp 96.9°F | Resp 12

## 2020-03-30 DIAGNOSIS — Z86010 Personal history of colonic polyps: Secondary | ICD-10-CM

## 2020-03-30 DIAGNOSIS — D122 Benign neoplasm of ascending colon: Secondary | ICD-10-CM | POA: Diagnosis not present

## 2020-03-30 MED ORDER — SODIUM CHLORIDE 0.9 % IV SOLN: 500.0000 mL | Freq: Once | INTRAVENOUS | Status: DC

## 2020-03-30 NOTE — Progress Notes (Signed)
PT taken to PACU. Monitors in place. VSS. Report given to RN. 

## 2020-03-30 NOTE — Progress Notes (Signed)
Vitals-SP  Pt's states no medical or surgical changes since previsit or office visit. 

## 2020-03-30 NOTE — Patient Instructions (Signed)
Handout provided on polyps.   YOU HAD AN ENDOSCOPIC PROCEDURE TODAY AT THE [facility]:   Refer to the procedure report that was given to you for any specific questions about what was found during the examination.  If the procedure report does not answer your questions, please call your gastroenterologist to clarify.  If you requested that your care partner not be given the details of your procedure findings, then the procedure report has been included in a sealed envelope for you to review at your convenience later.  YOU SHOULD EXPECT: Some feelings of bloating in the abdomen. Passage of more gas than usual.  Walking can help get rid of the air that was put into your GI tract during the procedure and reduce the bloating. If you had a lower endoscopy (such as a colonoscopy or flexible sigmoidoscopy) you may notice spotting of blood in your stool or on the toilet paper. If you underwent a bowel prep for your procedure, you may not have a normal bowel movement for a few days.  Please Note:  You might notice some irritation and congestion in your nose or some drainage.  This is from the oxygen used during your procedure.  There is no need for concern and it should clear up in a day or so.  SYMPTOMS TO REPORT IMMEDIATELY:  Following lower endoscopy (colonoscopy or flexible sigmoidoscopy):  Excessive amounts of blood in the stool  Significant tenderness or worsening of abdominal pains  Swelling of the abdomen that is new, acute  Fever of 100F or higher  For urgent or emergent issues, a gastroenterologist can be reached at any hour by calling [phone]. Do not use MyChart messaging for urgent concerns.    DIET:  We do recommend a small meal at first, but then you may proceed to your regular diet.  Drink plenty of fluids but you should avoid alcoholic beverages for 24 hours.  ACTIVITY:  You should plan to take it easy for the rest of today and you should NOT DRIVE or use heavy  machinery until tomorrow (because of the sedation medicines used during the test).    FOLLOW UP: Our staff will call the number listed on your records 48-72 hours following your procedure to check on you and address any questions or concerns that you may have regarding the information given to you following your procedure. If we do not reach you, we will leave a message.  We will attempt to reach you two times.  During this call, we will ask if you have developed any symptoms of COVID 19. If you develop any symptoms (ie: fever, flu-like symptoms, shortness of breath, cough etc.) before then, please call [phone].  If you test positive for Covid 19 in the 2 weeks post procedure, please call and report this information to us.    If any biopsies were taken you will be contacted by phone or by letter within the next 1-3 weeks.  Please call us at [phone] if you have not heard about the biopsies in 3 weeks.    SIGNATURES/CONFIDENTIALITY: You and/or your care partner have signed paperwork which will be entered into your electronic medical record.  These signatures attest to the fact that that the information above on your After Visit Summary has been reviewed and is understood.  Full responsibility of the confidentiality of this discharge information lies with you and/or your care-partner.  

## 2020-03-30 NOTE — Progress Notes (Signed)
Called to room to assist during endoscopic procedure.  Patient ID and intended procedure confirmed with present staff. Received instructions for my participation in the procedure from the performing physician.  

## 2020-03-30 NOTE — Op Note (Addendum)
[facility] Patient Name: [name] Procedure Date: 03/30/2020 9:24 AM MRN: [id number] Endoscopist: Docia Chuck. Henrene Pastor , MD Age: 64 Referring MD:  Date of Birth: Mar 15, 1956 Gender: Female Account #: [id number] Procedure:                Colonoscopy with cold snare polypectomy x 2 Indications:              High risk colon cancer surveillance: Personal                            history of non-advanced adenoma. Previous                            examination November 2015 Medicines:                Monitored Anesthesia Care Procedure:                Pre-Anesthesia Assessment:                           - Prior to the procedure, a History and Physical                            was performed, and patient medications and                            allergies were reviewed. The patient's tolerance of                            previous anesthesia was also reviewed. The risks                            and benefits of the procedure and the sedation                            options and risks were discussed with the patient.                            All questions were answered, and informed consent                            was obtained. Prior Anticoagulants: The patient has                            taken no previous anticoagulant or antiplatelet                            agents. ASA Grade Assessment: II - A patient with                            mild systemic disease. After reviewing the risks                            and benefits, the patient was deemed in  satisfactory condition to undergo the procedure.                           After obtaining informed consent, the colonoscope                            was passed under direct vision. Throughout the                            procedure, the patient's blood pressure, pulse, and                            oxygen saturations were monitored continuously. The                            Colonoscope  was introduced through the anus and                            advanced to the the cecum, identified by                            appendiceal orifice and ileocecal valve. The                            ileocecal valve, appendiceal orifice, and rectum                            were photographed. The quality of the bowel                            preparation was excellent. The colonoscopy was                            performed without difficulty. The patient tolerated                            the procedure well. The bowel preparation used was                            SUPREP via split dose instruction. Scope In: 9:35:58 AM Scope Out: 9:53:57 AM Scope Withdrawal Time: 0 hours 13 minutes 26 seconds  Total Procedure Duration: 0 hours 17 minutes 59 seconds  Findings:                 Two polyps were found in the ascending colon. The                            polyps were 1 to 6 mm in size. These polyps were                            removed with a cold snare. Resection and retrieval                            were complete.  The exam was otherwise without abnormality on                            direct and retroflexion views.                           NOTE: There were technical problems with multiple                            images taken for photodocumentation. Complications:            No immediate complications. Estimated blood loss:                            None. Estimated Blood Loss:     Estimated blood loss: none. Impression:               - Two 1 to 6 mm polyps in the ascending colon,                            removed with a cold snare. Resected and retrieved.                           - The examination was otherwise normal on direct                            and retroflexion views. Recommendation:           - Repeat colonoscopy in 5 years for surveillance.                           - Patient has a contact number available for                             emergencies. The signs and symptoms of potential                            delayed complications were discussed with the                            patient. Return to normal activities tomorrow.                            Written discharge instructions were provided to the                            patient.                           - Resume previous diet.                           - Continue present medications.                           - Await pathology results. Docia Chuck. Henrene Pastor, MD 03/30/2020 9:59:11 AM This report has  been signed electronically.

## 2020-04-01 ENCOUNTER — Telehealth: Payer: Self-pay | Admitting: *Deleted

## 2020-04-01 NOTE — Telephone Encounter (Signed)
  Follow up Call-  Call back number 03/30/2020 08/03/2017  Post procedure Call Back phone  # [phone]  Permission to leave phone message Yes Yes  Some recent data might be hidden     Patient questions:  Do you have a fever, pain , or abdominal swelling? No. Pain Score  0 *  Have you tolerated food without any problems? Yes.    Have you been able to return to your normal activities? Yes.    Do you have any questions about your discharge instructions: Diet   No. Medications  No. Follow up visit  No.  Do you have questions or concerns about your Care? No.  Actions: * If pain score is 4 or above: 1. No action needed, pain <4.Have you developed a fever since your procedure? no  2.   Have you had an respiratory symptoms (SOB or cough) since your procedure? no  3.   Have you tested positive for COVID 19 since your procedure no  4.   Have you had any family members/close contacts diagnosed with the COVID 19 since your procedure?  no   If yes to any of these questions please route to Joylene John, RN and Erenest Rasher, RN

## 2020-04-02 ENCOUNTER — Encounter: Payer: Self-pay | Admitting: Internal Medicine

## 2020-04-06 ENCOUNTER — Telehealth: Payer: Self-pay | Admitting: Internal Medicine

## 2020-04-06 NOTE — Telephone Encounter (Signed)
Pt is requesting a call back from a nurse regarding the colonoscopy she had last week.

## 2020-04-06 NOTE — Telephone Encounter (Signed)
Pt states she has not had a BM since her colonoscopy. Discussed with pt that she can take miralax up to 3 doses today to have a BM. If no BM try the same thing tomorrow. Pt knows to call back if she dose not have a BM. Pt knows to call if this does not help. Pt verbalized understanding.

## 2020-04-30 ENCOUNTER — Telehealth: Payer: Self-pay | Admitting: Internal Medicine

## 2020-04-30 NOTE — Telephone Encounter (Signed)
Pt had a colonoscopy a month ago with Dr Henrene Pastor , pt is experiencing a pain on her side that will not go away.

## 2020-05-01 NOTE — Telephone Encounter (Signed)
Patient has a long standing ruq pain.  She still has the same pain.  She is asking if the pain is related to her colonoscopy.  She is reassured that the pain is not related to the colonoscopy on 03/30/20.  She thanked me for the call

## 2020-07-02 ENCOUNTER — Other Ambulatory Visit: Payer: Self-pay | Admitting: Obstetrics & Gynecology

## 2020-07-02 DIAGNOSIS — R928 Other abnormal and inconclusive findings on diagnostic imaging of breast: Secondary | ICD-10-CM

## 2020-07-11 ENCOUNTER — Other Ambulatory Visit: Payer: Self-pay

## 2020-07-11 ENCOUNTER — Other Ambulatory Visit: Payer: Self-pay | Admitting: Obstetrics & Gynecology

## 2020-07-11 ENCOUNTER — Ambulatory Visit
Admission: RE | Admit: 2020-07-11 | Discharge: 2020-07-11 | Disposition: A | Discharge status: Home / Self Care | Payer: Medicare Other | Source: Ambulatory Visit | Attending: Obstetrics & Gynecology | Admitting: Obstetrics & Gynecology

## 2020-07-11 DIAGNOSIS — R921 Mammographic calcification found on diagnostic imaging of breast: Secondary | ICD-10-CM

## 2020-07-11 DIAGNOSIS — R928 Other abnormal and inconclusive findings on diagnostic imaging of breast: Secondary | ICD-10-CM

## 2021-01-18 ENCOUNTER — Ambulatory Visit
Admission: RE | Admit: 2021-01-18 | Discharge: 2021-01-18 | Disposition: A | Discharge status: Home / Self Care | Payer: Medicare Other | Source: Ambulatory Visit | Attending: Obstetrics & Gynecology | Admitting: Obstetrics & Gynecology

## 2021-01-18 ENCOUNTER — Other Ambulatory Visit: Payer: Self-pay

## 2021-01-18 ENCOUNTER — Other Ambulatory Visit: Payer: Self-pay | Admitting: Obstetrics & Gynecology

## 2021-01-18 DIAGNOSIS — R921 Mammographic calcification found on diagnostic imaging of breast: Secondary | ICD-10-CM

## 2021-07-12 ENCOUNTER — Ambulatory Visit
Admission: RE | Admit: 2021-07-12 | Discharge: 2021-07-12 | Disposition: A | Discharge status: Home / Self Care | Payer: Medicare Other | Source: Ambulatory Visit | Attending: Obstetrics & Gynecology | Admitting: Obstetrics & Gynecology

## 2021-07-12 ENCOUNTER — Other Ambulatory Visit: Payer: Self-pay

## 2021-07-12 DIAGNOSIS — R921 Mammographic calcification found on diagnostic imaging of breast: Secondary | ICD-10-CM

## 2021-12-27 IMAGING — MG MM DIGITAL DIAGNOSTIC UNILAT*L* W/ TOMO W/ CAD
7 series · 9 of 15 positions shown · non-contrast
Comparison: Previous exam(s).

CLINICAL DATA: Short-term interval follow-up of probable benign
calcifications in the left breast.

EXAM:
DIGITAL DIAGNOSTIC UNILATERAL LEFT MAMMOGRAM WITH TOMOSYNTHESIS AND
CAD
TECHNIQUE: Left digital diagnostic mammography and breast tomosynthesis was
performed. The images were evaluated with computer-aided detection.

[L ML (1 of 2)]
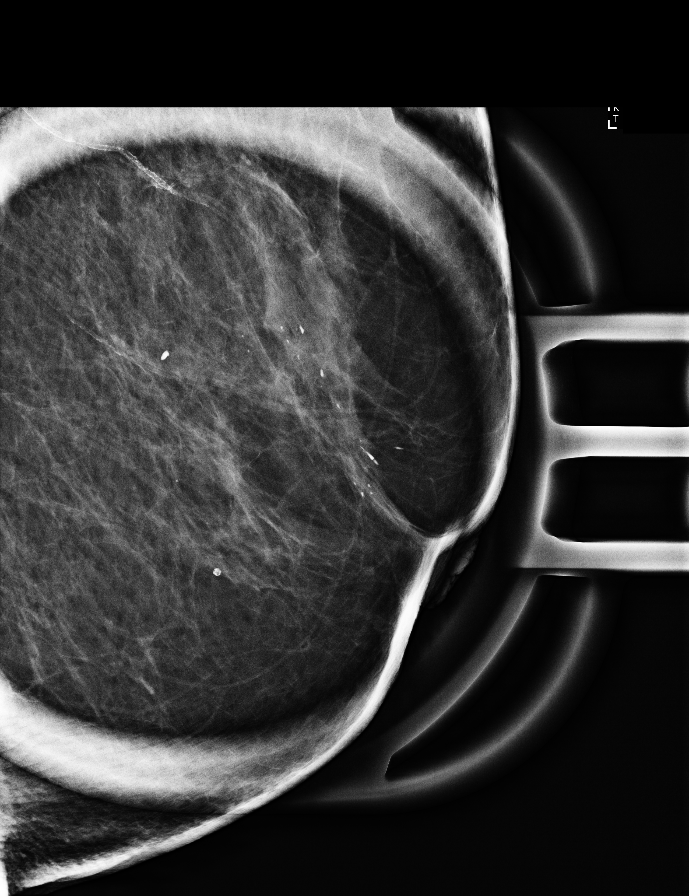

[L CC]
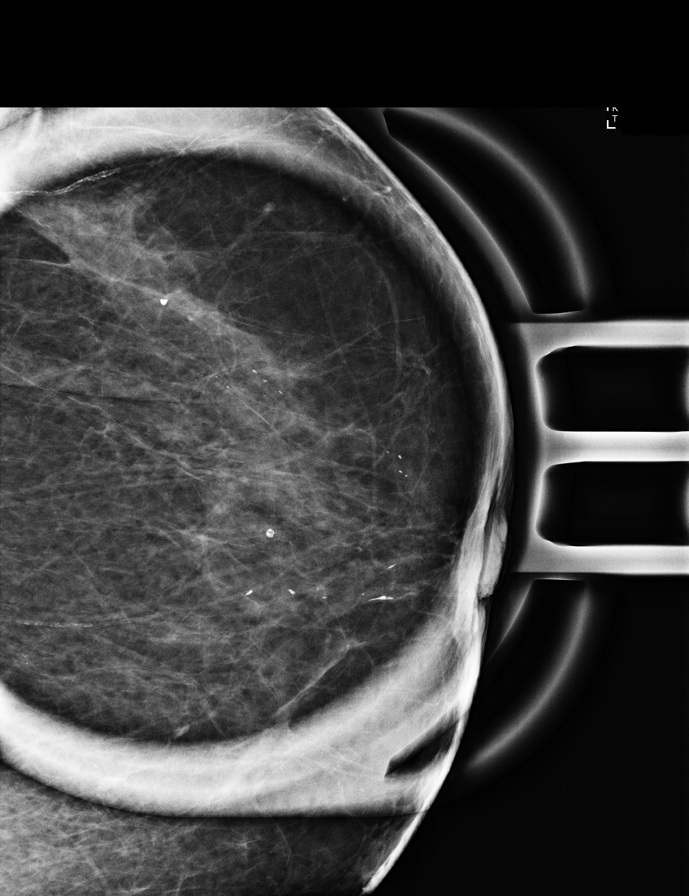

[L ML (2 of 2)]
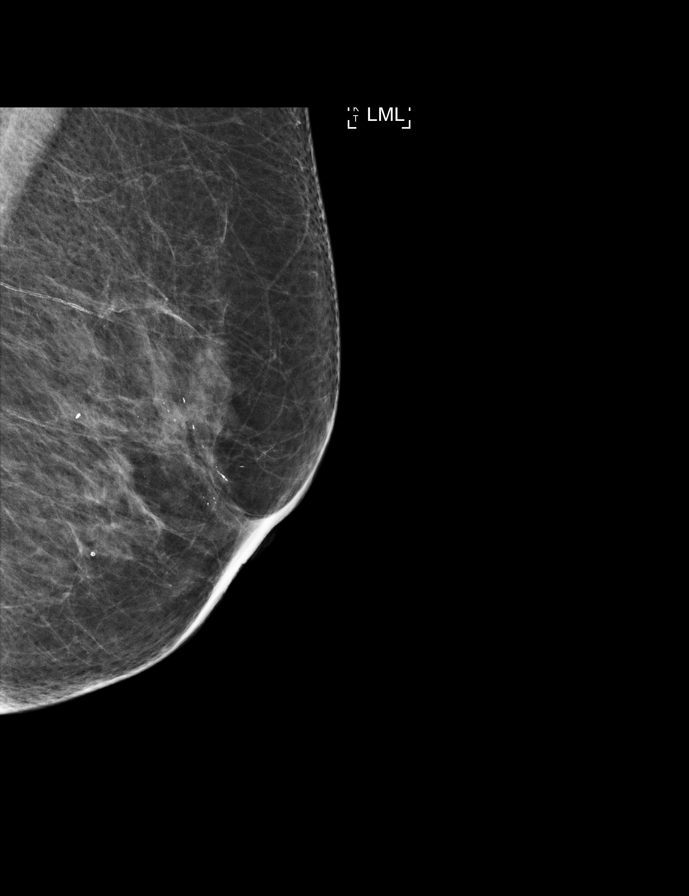

[L MLO synth-2D]
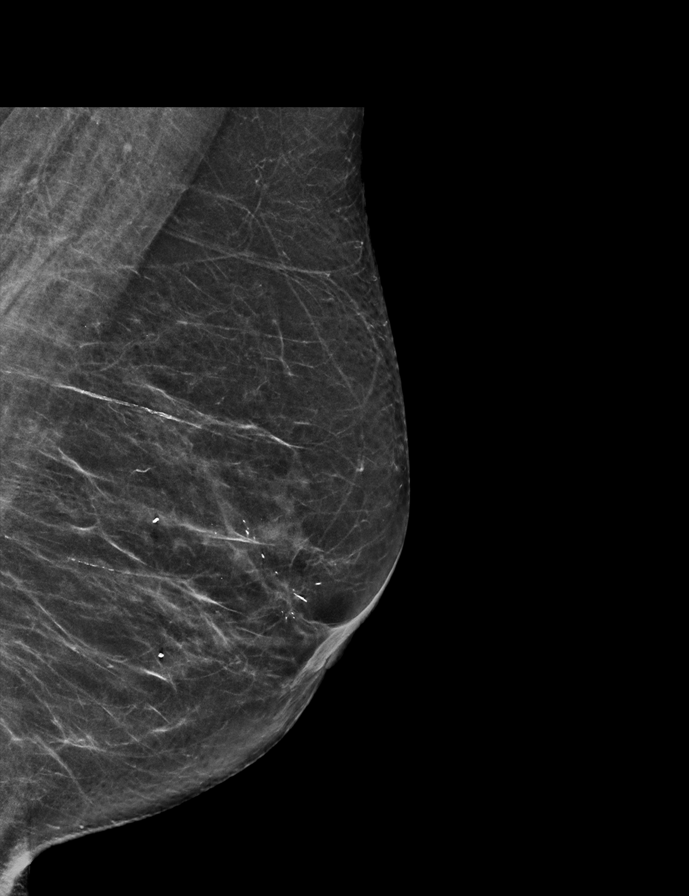

[L CC synth-2D]
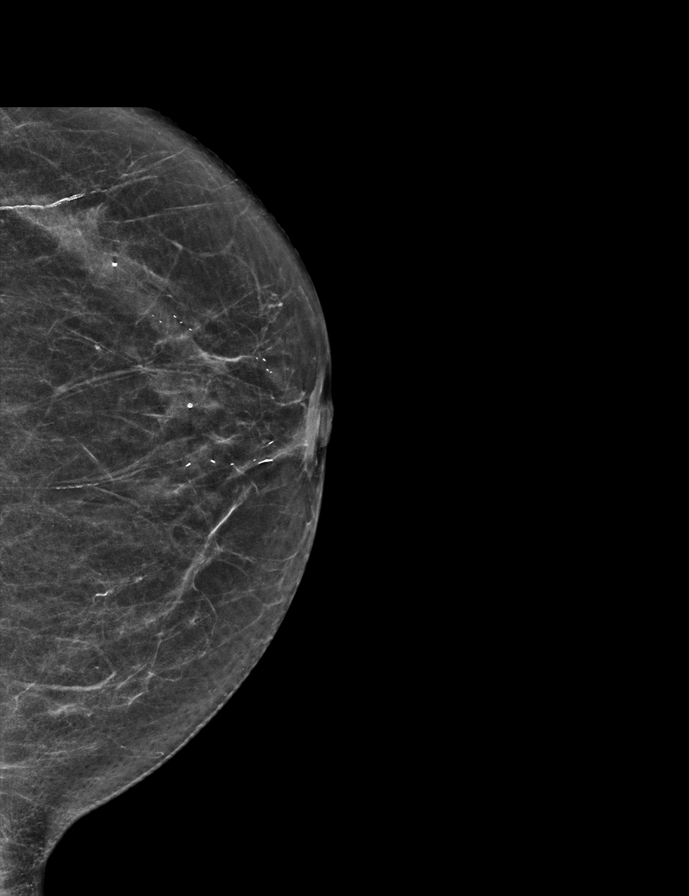

[L MLO tomo · 3 of 60 frames shown]
[frame 20/60]
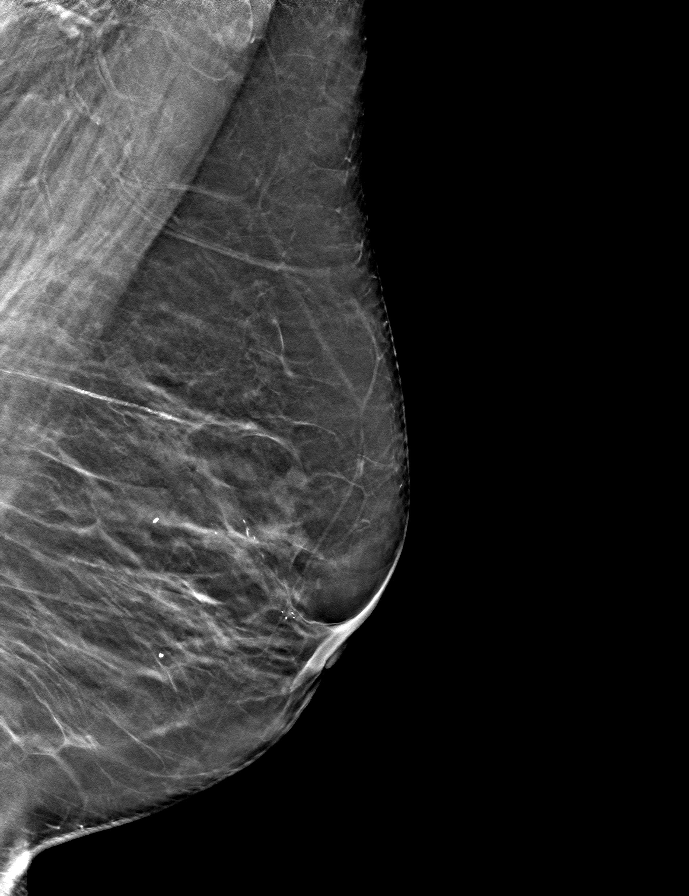
[frame 31/60]
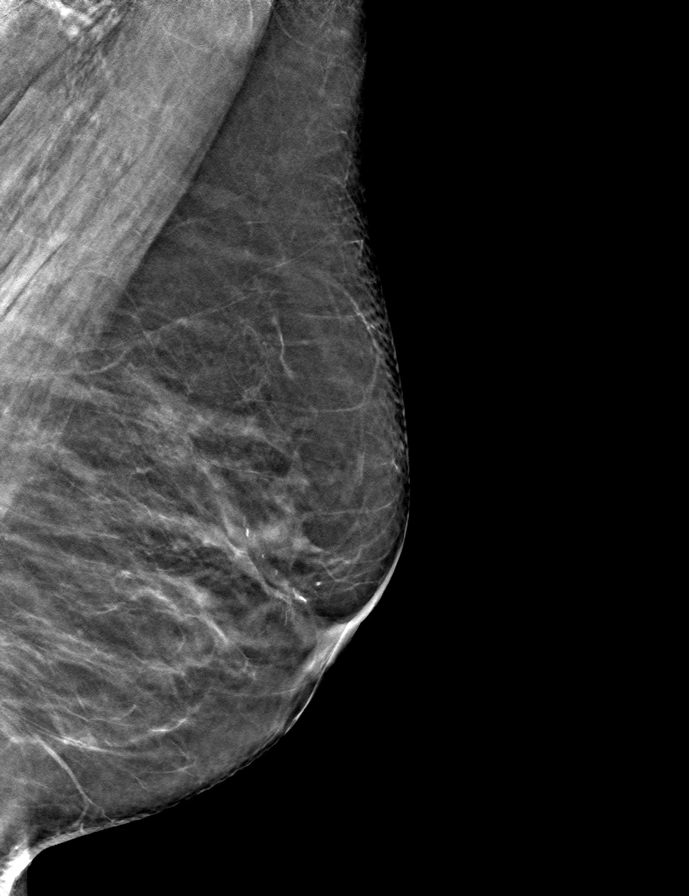
[frame 41/60]
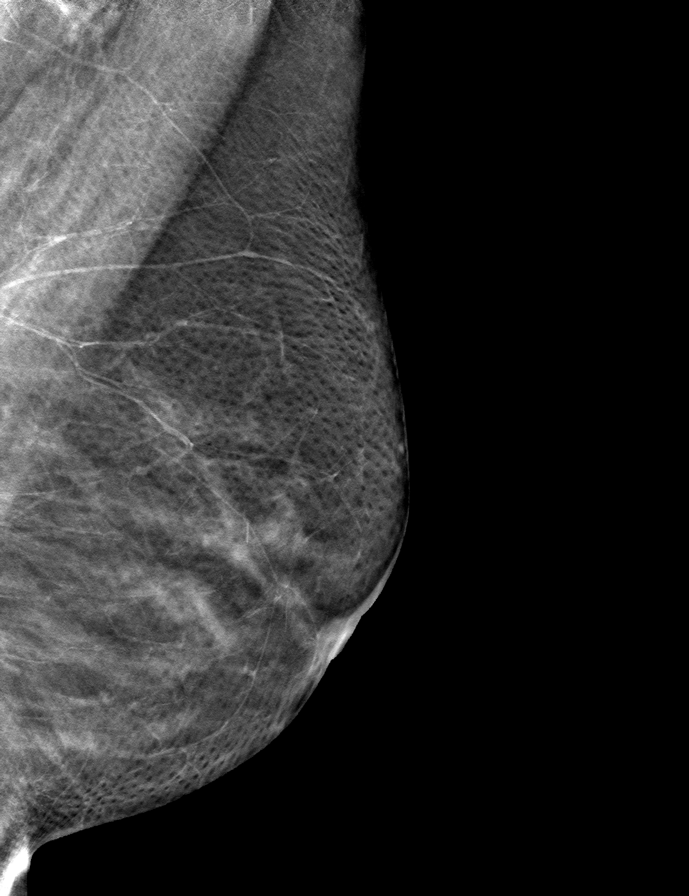

[L CC tomo · tomo slice 27/54.0]
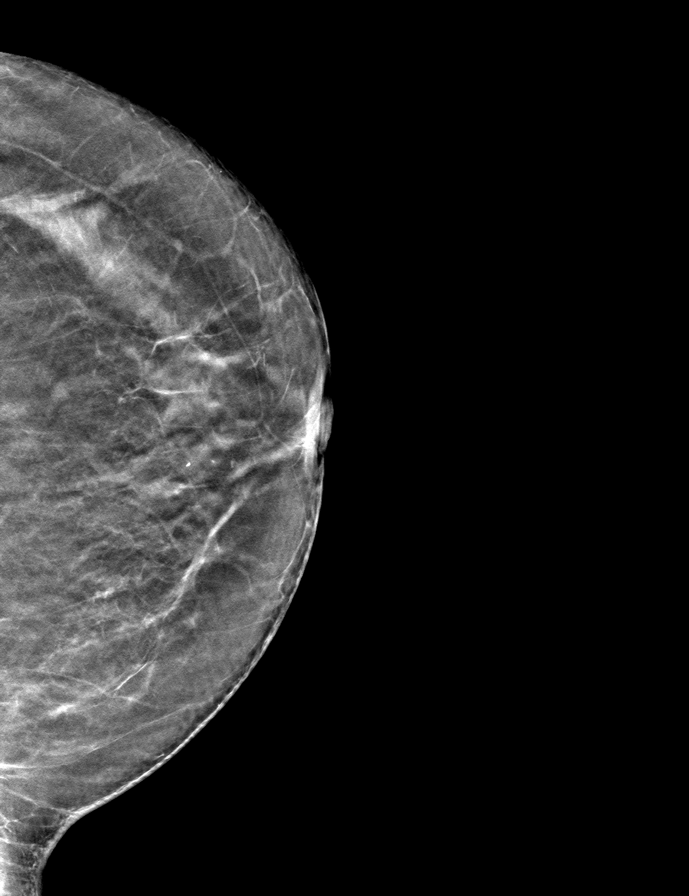

[9 of 15 positions shown; findings below may reference images not displayed]

ACR Breast Density Category b: There are scattered areas of
fibroglandular density.
FINDINGS: Coarse rod-like calcifications are again seen in the periareolar
upper outer left breast. They are felt to be secretory. No
suspicious calcifications or mass identified.
IMPRESSION: Probable benign secretory calcifications in the left breast.

RECOMMENDATION:
Short-term interval follow-up bilateral diagnostic mammogram in 6
months is recommended.

I have discussed the findings and recommendations with the patient.
If applicable, a reminder letter will be sent to the patient
regarding the next appointment.

BI-RADS CATEGORY  3: Probably benign.

## 2023-02-13 ENCOUNTER — Other Ambulatory Visit: Payer: Self-pay | Admitting: Obstetrics & Gynecology

## 2023-02-13 DIAGNOSIS — R921 Mammographic calcification found on diagnostic imaging of breast: Secondary | ICD-10-CM

## 2023-03-06 ENCOUNTER — Ambulatory Visit
Admission: RE | Admit: 2023-03-06 | Discharge: 2023-03-06 | Disposition: A | Discharge status: Home / Self Care | Payer: Medicare Other | Source: Ambulatory Visit | Attending: Obstetrics & Gynecology | Admitting: Obstetrics & Gynecology

## 2023-03-06 DIAGNOSIS — R921 Mammographic calcification found on diagnostic imaging of breast: Secondary | ICD-10-CM
